# Patient Record
Sex: Female | Born: 1955 | Race: White | Hispanic: No | Marital: Married | Smoking: Never smoker
Health system: Southern US, Community
[De-identification: ages and names within clinical notes are randomized; demographics above are authoritative.]

## PROBLEM LIST (undated history)

## (undated) DIAGNOSIS — M8580 Other specified disorders of bone density and structure, unspecified site: Secondary | ICD-10-CM

## (undated) DIAGNOSIS — G479 Sleep disorder, unspecified: Secondary | ICD-10-CM

## (undated) DIAGNOSIS — T7840XA Allergy, unspecified, initial encounter: Secondary | ICD-10-CM

## (undated) DIAGNOSIS — E785 Hyperlipidemia, unspecified: Secondary | ICD-10-CM

## (undated) DIAGNOSIS — M069 Rheumatoid arthritis, unspecified: Secondary | ICD-10-CM

## (undated) DIAGNOSIS — F329 Major depressive disorder, single episode, unspecified: Secondary | ICD-10-CM

## (undated) DIAGNOSIS — E119 Type 2 diabetes mellitus without complications: Secondary | ICD-10-CM

## (undated) DIAGNOSIS — F32A Depression, unspecified: Secondary | ICD-10-CM

## (undated) HISTORY — DX: Other specified disorders of bone density and structure, unspecified site: M85.80

## (undated) HISTORY — DX: Depression, unspecified: F32.A

## (undated) HISTORY — DX: Major depressive disorder, single episode, unspecified: F32.9

## (undated) HISTORY — PX: TONSILLECTOMY: SUR1361

## (undated) HISTORY — DX: Hyperlipidemia, unspecified: E78.5

## (undated) HISTORY — DX: Sleep disorder, unspecified: G47.9

## (undated) HISTORY — DX: Type 2 diabetes mellitus without complications: E11.9

## (undated) HISTORY — DX: Allergy, unspecified, initial encounter: T78.40XA

## (undated) HISTORY — DX: Rheumatoid arthritis, unspecified: M06.9

---

## 1999-07-22 DIAGNOSIS — M069 Rheumatoid arthritis, unspecified: Secondary | ICD-10-CM

## 1999-07-22 HISTORY — DX: Rheumatoid arthritis, unspecified: M06.9

## 2005-07-21 DIAGNOSIS — M8580 Other specified disorders of bone density and structure, unspecified site: Secondary | ICD-10-CM

## 2005-07-21 HISTORY — DX: Other specified disorders of bone density and structure, unspecified site: M85.80

## 2006-09-23 LAB — HM COLONOSCOPY

## 2010-11-26 ENCOUNTER — Inpatient Hospital Stay (INDEPENDENT_AMBULATORY_CARE_PROVIDER_SITE_OTHER)
Admission: RE | Admit: 2010-11-26 | Discharge: 2010-11-26 | Disposition: A | Discharge status: Home / Self Care | Payer: PRIVATE HEALTH INSURANCE | Source: Ambulatory Visit | Attending: Family Medicine | Admitting: Family Medicine

## 2010-11-26 DIAGNOSIS — J40 Bronchitis, not specified as acute or chronic: Secondary | ICD-10-CM

## 2010-11-26 DIAGNOSIS — J069 Acute upper respiratory infection, unspecified: Secondary | ICD-10-CM

## 2011-03-20 LAB — HM MAMMOGRAPHY: HM Mammogram: NORMAL

## 2012-03-01 ENCOUNTER — Ambulatory Visit (INDEPENDENT_AMBULATORY_CARE_PROVIDER_SITE_OTHER): Payer: BC Managed Care – PPO | Admitting: Internal Medicine

## 2012-03-01 ENCOUNTER — Encounter: Payer: Self-pay | Admitting: Internal Medicine

## 2012-03-01 ENCOUNTER — Other Ambulatory Visit (HOSPITAL_COMMUNITY)
Admission: RE | Admit: 2012-03-01 | Discharge: 2012-03-01 | Disposition: A | Discharge status: Home / Self Care | Payer: BC Managed Care – PPO | Source: Ambulatory Visit | Attending: Internal Medicine | Admitting: Internal Medicine

## 2012-03-01 VITALS — BP 130/70 | HR 69 | Temp 98.4°F | Resp 16 | Ht 66.0 in | Wt 151.5 lb

## 2012-03-01 DIAGNOSIS — E785 Hyperlipidemia, unspecified: Secondary | ICD-10-CM | POA: Insufficient documentation

## 2012-03-01 DIAGNOSIS — M069 Rheumatoid arthritis, unspecified: Secondary | ICD-10-CM

## 2012-03-01 DIAGNOSIS — E7800 Pure hypercholesterolemia, unspecified: Secondary | ICD-10-CM

## 2012-03-01 DIAGNOSIS — Z01419 Encounter for gynecological examination (general) (routine) without abnormal findings: Secondary | ICD-10-CM | POA: Insufficient documentation

## 2012-03-01 DIAGNOSIS — Z124 Encounter for screening for malignant neoplasm of cervix: Secondary | ICD-10-CM

## 2012-03-01 DIAGNOSIS — K635 Polyp of colon: Secondary | ICD-10-CM

## 2012-03-01 DIAGNOSIS — E118 Type 2 diabetes mellitus with unspecified complications: Secondary | ICD-10-CM | POA: Insufficient documentation

## 2012-03-01 DIAGNOSIS — Z860100 Personal history of colon polyps, unspecified: Secondary | ICD-10-CM | POA: Insufficient documentation

## 2012-03-01 DIAGNOSIS — R7309 Other abnormal glucose: Secondary | ICD-10-CM

## 2012-03-01 DIAGNOSIS — Z0000 Encounter for general adult medical examination without abnormal findings: Secondary | ICD-10-CM | POA: Insufficient documentation

## 2012-03-01 DIAGNOSIS — M899 Disorder of bone, unspecified: Secondary | ICD-10-CM

## 2012-03-01 DIAGNOSIS — M8580 Other specified disorders of bone density and structure, unspecified site: Secondary | ICD-10-CM | POA: Insufficient documentation

## 2012-03-01 DIAGNOSIS — D126 Benign neoplasm of colon, unspecified: Secondary | ICD-10-CM

## 2012-03-01 LAB — HM PAP SMEAR: HM Pap smear: NORMAL

## 2012-03-01 LAB — FECAL OCCULT BLOOD, GUAIAC: Fecal Occult Blood: NEGATIVE

## 2012-03-01 NOTE — Assessment & Plan Note (Signed)
Exam done, PAP sent, labs were ordered, vaccines were reviewed, pt ed material was given

## 2012-03-01 NOTE — Patient Instructions (Signed)
Preventive Care for Adults, Female A healthy lifestyle and preventive care can promote health and wellness. Preventive health guidelines for women include the following key practices.  A routine yearly physical is a good way to check with your caregiver about your health and preventive screening. It is a chance to share any concerns and updates on your health, and to receive a thorough exam.   Visit your dentist for a routine exam and preventive care every 6 months. Brush your teeth twice a day and floss once a day. Good oral hygiene prevents tooth decay and gum disease.   The frequency of eye exams is based on your age, health, family medical history, use of contact lenses, and other factors. Follow your caregiver's recommendations for frequency of eye exams.   Eat a healthy diet. Foods like vegetables, fruits, whole grains, low-fat dairy products, and lean protein foods contain the nutrients you need without too many calories. Decrease your intake of foods high in solid fats, added sugars, and salt. Eat the right amount of calories for you.Get information about a proper diet from your caregiver, if necessary.   Regular physical exercise is one of the most important things you can do for your health. Most adults should get at least 150 minutes of moderate-intensity exercise (any activity that increases your heart rate and causes you to sweat) each week. In addition, most adults need muscle-strengthening exercises on 2 or more days a week.   Maintain a healthy weight. The body mass index (BMI) is a screening tool to identify possible weight problems. It provides an estimate of body fat based on height and weight. Your caregiver can help determine your BMI, and can help you achieve or maintain a healthy weight.For adults 20 years and older:   A BMI below 18.5 is considered underweight.   A BMI of 18.5 to 24.9 is normal.   A BMI of 25 to 29.9 is considered overweight.   A BMI of 30 and above is  considered obese.   Maintain normal blood lipids and cholesterol levels by exercising and minimizing your intake of saturated fat. Eat a balanced diet with plenty of fruit and vegetables. Blood tests for lipids and cholesterol should begin at age 20 and be repeated every 5 years. If your lipid or cholesterol levels are high, you are over 50, or you are at high risk for heart disease, you may need your cholesterol levels checked more frequently.Ongoing high lipid and cholesterol levels should be treated with medicines if diet and exercise are not effective.   If you smoke, find out from your caregiver how to quit. If you do not use tobacco, do not start.   If you are pregnant, do not drink alcohol. If you are breastfeeding, be very cautious about drinking alcohol. If you are not pregnant and choose to drink alcohol, do not exceed 1 drink per day. One drink is considered to be 12 ounces (355 mL) of beer, 5 ounces (148 mL) of wine, or 1.5 ounces (44 mL) of liquor.   Avoid use of street drugs. Do not share needles with anyone. Ask for help if you need support or instructions about stopping the use of drugs.   High blood pressure causes heart disease and increases the risk of stroke. Your blood pressure should be checked at least every 1 to 2 years. Ongoing high blood pressure should be treated with medicines if weight loss and exercise are not effective.   If you are 55 to 56   years old, ask your caregiver if you should take aspirin to prevent strokes.   Diabetes screening involves taking a blood sample to check your fasting blood sugar level. This should be done once every 3 years, after age 45, if you are within normal weight and without risk factors for diabetes. Testing should be considered at a younger age or be carried out more frequently if you are overweight and have at least 1 risk factor for diabetes.   Breast cancer screening is essential preventive care for women. You should practice "breast  self-awareness." This means understanding the normal appearance and feel of your breasts and may include breast self-examination. Any changes detected, no matter how small, should be reported to a caregiver. Women in their 20s and 30s should have a clinical breast exam (CBE) by a caregiver as part of a regular health exam every 1 to 3 years. After age 40, women should have a CBE every year. Starting at age 40, women should consider having a mammography (breast X-ray test) every year. Women who have a family history of breast cancer should talk to their caregiver about genetic screening. Women at a high risk of breast cancer should talk to their caregivers about having magnetic resonance imaging (MRI) and a mammography every year.   The Pap test is a screening test for cervical cancer. A Pap test can show cell changes on the cervix that might become cervical cancer if left untreated. A Pap test is a procedure in which cells are obtained and examined from the lower end of the uterus (cervix).   Women should have a Pap test starting at age 21.   Between ages 21 and 29, Pap tests should be repeated every 2 years.   Beginning at age 30, you should have a Pap test every 3 years as long as the past 3 Pap tests have been normal.   Some women have medical problems that increase the chance of getting cervical cancer. Talk to your caregiver about these problems. It is especially important to talk to your caregiver if a new problem develops soon after your last Pap test. In these cases, your caregiver may recommend more frequent screening and Pap tests.   The above recommendations are the same for women who have or have not gotten the vaccine for human papillomavirus (HPV).   If you had a hysterectomy for a problem that was not cancer or a condition that could lead to cancer, then you no longer need Pap tests. Even if you no longer need a Pap test, a regular exam is a good idea to make sure no other problems are  starting.   If you are between ages 65 and 70, and you have had normal Pap tests going back 10 years, you no longer need Pap tests. Even if you no longer need a Pap test, a regular exam is a good idea to make sure no other problems are starting.   If you have had past treatment for cervical cancer or a condition that could lead to cancer, you need Pap tests and screening for cancer for at least 20 years after your treatment.   If Pap tests have been discontinued, risk factors (such as a new sexual partner) need to be reassessed to determine if screening should be resumed.   The HPV test is an additional test that may be used for cervical cancer screening. The HPV test looks for the virus that can cause the cell changes on the cervix.   The cells collected during the Pap test can be tested for HPV. The HPV test could be used to screen women aged 30 years and older, and should be used in women of any age who have unclear Pap test results. After the age of 30, women should have HPV testing at the same frequency as a Pap test.   Colorectal cancer can be detected and often prevented. Most routine colorectal cancer screening begins at the age of 50 and continues through age 75. However, your caregiver may recommend screening at an earlier age if you have risk factors for colon cancer. On a yearly basis, your caregiver may provide home test kits to check for hidden blood in the stool. Use of a small camera at the end of a tube, to directly examine the colon (sigmoidoscopy or colonoscopy), can detect the earliest forms of colorectal cancer. Talk to your caregiver about this at age 50, when routine screening begins. Direct examination of the colon should be repeated every 5 to 10 years through age 75, unless early forms of pre-cancerous polyps or small growths are found.   Hepatitis C blood testing is recommended for all people born from 1945 through 1965 and any individual with known risks for hepatitis C.    Practice safe sex. Use condoms and avoid high-risk sexual practices to reduce the spread of sexually transmitted infections (STIs). STIs include gonorrhea, chlamydia, syphilis, trichomonas, herpes, HPV, and human immunodeficiency virus (HIV). Herpes, HIV, and HPV are viral illnesses that have no cure. They can result in disability, cancer, and death. Sexually active women aged 25 and younger should be checked for chlamydia. Older women with new or multiple partners should also be tested for chlamydia. Testing for other STIs is recommended if you are sexually active and at increased risk.   Osteoporosis is a disease in which the bones lose minerals and strength with aging. This can result in serious bone fractures. The risk of osteoporosis can be identified using a bone density scan. Women ages 65 and over and women at risk for fractures or osteoporosis should discuss screening with their caregivers. Ask your caregiver whether you should take a calcium supplement or vitamin D to reduce the rate of osteoporosis.   Menopause can be associated with physical symptoms and risks. Hormone replacement therapy is available to decrease symptoms and risks. You should talk to your caregiver about whether hormone replacement therapy is right for you.   Use sunscreen with sun protection factor (SPF) of 30 or more. Apply sunscreen liberally and repeatedly throughout the day. You should seek shade when your shadow is shorter than you. Protect yourself by wearing long sleeves, pants, a wide-brimmed hat, and sunglasses year round, whenever you are outdoors.   Once a month, do a whole body skin exam, using a mirror to look at the skin on your back. Notify your caregiver of new moles, moles that have irregular borders, moles that are larger than a pencil eraser, or moles that have changed in shape or color.   Stay current with required immunizations.   Influenza. You need a dose every fall (or winter). The composition of  the flu vaccine changes each year, so being vaccinated once is not enough.   Pneumococcal polysaccharide. You need 1 to 2 doses if you smoke cigarettes or if you have certain chronic medical conditions. You need 1 dose at age 65 (or older) if you have never been vaccinated.   Tetanus, diphtheria, pertussis (Tdap, Td). Get 1 dose of   Tdap vaccine if you are younger than age 65, are over 65 and have contact with an infant, are a healthcare worker, are pregnant, or simply want to be protected from whooping cough. After that, you need a Td booster dose every 10 years. Consult your caregiver if you have not had at least 3 tetanus and diphtheria-containing shots sometime in your life or have a deep or dirty wound.   HPV. You need this vaccine if you are a woman age 26 or younger. The vaccine is given in 3 doses over 6 months.   Measles, mumps, rubella (MMR). You need at least 1 dose of MMR if you were born in 1957 or later. You may also need a second dose.   Meningococcal. If you are age 19 to 21 and a first-year college student living in a residence hall, or have one of several medical conditions, you need to get vaccinated against meningococcal disease. You may also need additional booster doses.   Zoster (shingles). If you are age 60 or older, you should get this vaccine.   Varicella (chickenpox). If you have never had chickenpox or you were vaccinated but received only 1 dose, talk to your caregiver to find out if you need this vaccine.   Hepatitis A. You need this vaccine if you have a specific risk factor for hepatitis A virus infection or you simply wish to be protected from this disease. The vaccine is usually given as 2 doses, 6 to 18 months apart.   Hepatitis B. You need this vaccine if you have a specific risk factor for hepatitis B virus infection or you simply wish to be protected from this disease. The vaccine is given in 3 doses, usually over 6 months.  Preventive Services /  Frequency Ages 19 to 39  Blood pressure check.** / Every 1 to 2 years.   Lipid and cholesterol check.** / Every 5 years beginning at age 20.   Clinical breast exam.** / Every 3 years for women in their 20s and 30s.   Pap test.** / Every 2 years from ages 21 through 29. Every 3 years starting at age 30 through age 65 or 70 with a history of 3 consecutive normal Pap tests.   HPV screening.** / Every 3 years from ages 30 through ages 65 to 70 with a history of 3 consecutive normal Pap tests.   Hepatitis C blood test.** / For any individual with known risks for hepatitis C.   Skin self-exam. / Monthly.   Influenza immunization.** / Every year.   Pneumococcal polysaccharide immunization.** / 1 to 2 doses if you smoke cigarettes or if you have certain chronic medical conditions.   Tetanus, diphtheria, pertussis (Tdap, Td) immunization. / A one-time dose of Tdap vaccine. After that, you need a Td booster dose every 10 years.   HPV immunization. / 3 doses over 6 months, if you are 26 and younger.   Measles, mumps, rubella (MMR) immunization. / You need at least 1 dose of MMR if you were born in 1957 or later. You may also need a second dose.   Meningococcal immunization. / 1 dose if you are age 19 to 21 and a first-year college student living in a residence hall, or have one of several medical conditions, you need to get vaccinated against meningococcal disease. You may also need additional booster doses.   Varicella immunization.** / Consult your caregiver.   Hepatitis A immunization.** / Consult your caregiver. 2 doses, 6 to 18 months   apart.   Hepatitis B immunization.** / Consult your caregiver. 3 doses usually over 6 months.  Ages 40 to 64  Blood pressure check.** / Every 1 to 2 years.   Lipid and cholesterol check.** / Every 5 years beginning at age 20.   Clinical breast exam.** / Every year after age 40.   Mammogram.** / Every year beginning at age 40 and continuing for as  long as you are in good health. Consult with your caregiver.   Pap test.** / Every 3 years starting at age 30 through age 65 or 70 with a history of 3 consecutive normal Pap tests.   HPV screening.** / Every 3 years from ages 30 through ages 65 to 70 with a history of 3 consecutive normal Pap tests.   Fecal occult blood test (FOBT) of stool. / Every year beginning at age 50 and continuing until age 75. You may not need to do this test if you get a colonoscopy every 10 years.   Flexible sigmoidoscopy or colonoscopy.** / Every 5 years for a flexible sigmoidoscopy or every 10 years for a colonoscopy beginning at age 50 and continuing until age 75.   Hepatitis C blood test.** / For all people born from 1945 through 1965 and any individual with known risks for hepatitis C.   Skin self-exam. / Monthly.   Influenza immunization.** / Every year.   Pneumococcal polysaccharide immunization.** / 1 to 2 doses if you smoke cigarettes or if you have certain chronic medical conditions.   Tetanus, diphtheria, pertussis (Tdap, Td) immunization.** / A one-time dose of Tdap vaccine. After that, you need a Td booster dose every 10 years.   Measles, mumps, rubella (MMR) immunization. / You need at least 1 dose of MMR if you were born in 1957 or later. You may also need a second dose.   Varicella immunization.** / Consult your caregiver.   Meningococcal immunization.** / Consult your caregiver.   Hepatitis A immunization.** / Consult your caregiver. 2 doses, 6 to 18 months apart.   Hepatitis B immunization.** / Consult your caregiver. 3 doses, usually over 6 months.  Ages 65 and over  Blood pressure check.** / Every 1 to 2 years.   Lipid and cholesterol check.** / Every 5 years beginning at age 20.   Clinical breast exam.** / Every year after age 40.   Mammogram.** / Every year beginning at age 40 and continuing for as long as you are in good health. Consult with your caregiver.   Pap test.** /  Every 3 years starting at age 30 through age 65 or 70 with a 3 consecutive normal Pap tests. Testing can be stopped between 65 and 70 with 3 consecutive normal Pap tests and no abnormal Pap or HPV tests in the past 10 years.   HPV screening.** / Every 3 years from ages 30 through ages 65 or 70 with a history of 3 consecutive normal Pap tests. Testing can be stopped between 65 and 70 with 3 consecutive normal Pap tests and no abnormal Pap or HPV tests in the past 10 years.   Fecal occult blood test (FOBT) of stool. / Every year beginning at age 50 and continuing until age 75. You may not need to do this test if you get a colonoscopy every 10 years.   Flexible sigmoidoscopy or colonoscopy.** / Every 5 years for a flexible sigmoidoscopy or every 10 years for a colonoscopy beginning at age 50 and continuing until age 75.   Hepatitis   C blood test.** / For all people born from 1945 through 1965 and any individual with known risks for hepatitis C.   Osteoporosis screening.** / A one-time screening for women ages 65 and over and women at risk for fractures or osteoporosis.   Skin self-exam. / Monthly.   Influenza immunization.** / Every year.   Pneumococcal polysaccharide immunization.** / 1 dose at age 65 (or older) if you have never been vaccinated.   Tetanus, diphtheria, pertussis (Tdap, Td) immunization. / A one-time dose of Tdap vaccine if you are over 65 and have contact with an infant, are a healthcare worker, or simply want to be protected from whooping cough. After that, you need a Td booster dose every 10 years.   Varicella immunization.** / Consult your caregiver.   Meningococcal immunization.** / Consult your caregiver.   Hepatitis A immunization.** / Consult your caregiver. 2 doses, 6 to 18 months apart.   Hepatitis B immunization.** / Check with your caregiver. 3 doses, usually over 6 months.  ** Family history and personal history of risk and conditions may change your caregiver's  recommendations. Document Released: 09/02/2001 Document Revised: 06/26/2011 Document Reviewed: 12/02/2010 [employer] Patient Information 2012 [employer]. 

## 2012-03-01 NOTE — Assessment & Plan Note (Signed)
I will check her Vit D level and have asked her to get an updated DEXA scan done

## 2012-03-01 NOTE — Progress Notes (Signed)
Subjective:    Patient ID: Michele Adkins, female    DOB: [birth year], 56 y.o.   MRN: [id number]  HPI  New to me for a complete physical, she has mild arthralgias in her hands but otherwise she feels well.  Review of Systems  Constitutional: Negative for fever, chills, diaphoresis, activity change, appetite change, fatigue and unexpected weight change.  HENT: Negative.   Eyes: Negative.   Respiratory: Negative for cough, chest tightness, shortness of breath, wheezing and stridor.   Cardiovascular: Negative for chest pain, palpitations and leg swelling.  Gastrointestinal: Negative for nausea, vomiting, abdominal pain, diarrhea, constipation, blood in stool and anal bleeding.  Genitourinary: Negative.   Musculoskeletal: Positive for arthralgias. Negative for myalgias, back pain, joint swelling and gait problem.  Skin: Negative for color change, pallor, rash and wound.  Neurological: Negative.   Hematological: Negative for adenopathy. Does not bruise/bleed easily.  Psychiatric/Behavioral: Negative.        Objective:   Physical Exam  Vitals reviewed. Constitutional: She is oriented to person, place, and time. She appears well-developed and well-nourished. No distress.  HENT:  Head: Normocephalic and atraumatic.  Mouth/Throat: Oropharynx is clear and moist. No oropharyngeal exudate.  Eyes: Conjunctivae are normal. Right eye exhibits no discharge. Left eye exhibits no discharge. No scleral icterus.  Neck: Normal range of motion. Neck supple. No JVD present. No tracheal deviation present. No thyromegaly present.  Cardiovascular: Normal rate, regular rhythm, normal heart sounds and intact distal pulses.  Exam reveals no gallop and no friction rub.   No murmur heard. Pulmonary/Chest: Effort normal and breath sounds normal. No stridor. No respiratory distress. She has no wheezes. She has no rales. She exhibits no tenderness.  Abdominal: Soft. Bowel sounds are normal. She exhibits no  distension and no mass. There is no tenderness. There is no rebound and no guarding. Hernia confirmed negative in the right inguinal area and confirmed negative in the left inguinal area.  Genitourinary: Vagina normal and uterus normal. Rectal exam shows no external hemorrhoid, no internal hemorrhoid, no fissure, no mass, no tenderness and anal tone normal. Guaiac negative stool. No breast swelling, tenderness, discharge or bleeding. Pelvic exam was performed with patient supine. No labial fusion. There is no rash, tenderness, lesion or injury on the right labia. There is no rash, tenderness, lesion or injury on the left labia. Uterus is not deviated, not enlarged, not fixed and not tender. Cervix exhibits no motion tenderness, no discharge and no friability. Right adnexum displays no mass, no tenderness and no fullness. Left adnexum displays no mass, no tenderness and no fullness. No erythema, tenderness or bleeding around the vagina. No foreign body around the vagina. No signs of injury around the vagina. No vaginal discharge found.  Musculoskeletal: Normal range of motion. She exhibits no edema and no tenderness.  Lymphadenopathy:    She has no cervical adenopathy.       Right: No inguinal adenopathy present.       Left: No inguinal adenopathy present.  Neurological: She is alert and oriented to person, place, and time. She has normal reflexes. She displays normal reflexes. No cranial nerve deficit. She exhibits normal muscle tone. Coordination normal.  Skin: Skin is warm and dry. No rash noted. She is not diaphoretic. No erythema. No pallor.  Psychiatric: She has a normal mood and affect. Her behavior is normal. Judgment and thought content normal.      No results found for this basename: WBC, HGB, HCT, PLT, GLUCOSE, CHOL, TRIG, HDL, LDLDIRECT,  LDLCALC, ALT, AST, NA, K, CL, CREATININE, BUN, CO2, TSH, PSA, INR, GLUF, HGBA1C, MICROALBUR      Assessment & Plan:

## 2012-03-01 NOTE — Assessment & Plan Note (Signed)
At her request I will check her ESR and CRP today and I have referred her to Rheumatology for further evaluation

## 2012-03-01 NOTE — Assessment & Plan Note (Signed)
FLP ordered

## 2012-03-01 NOTE — Assessment & Plan Note (Signed)
I will check her A1C to see if she has developed DM II 

## 2012-03-01 NOTE — Assessment & Plan Note (Signed)
She is due for a repeat colonoscopy 

## 2012-03-02 ENCOUNTER — Encounter: Payer: Self-pay | Admitting: Internal Medicine

## 2012-03-05 ENCOUNTER — Encounter: Payer: Self-pay | Admitting: Internal Medicine

## 2012-03-09 ENCOUNTER — Ambulatory Visit (INDEPENDENT_AMBULATORY_CARE_PROVIDER_SITE_OTHER)
Admission: RE | Admit: 2012-03-09 | Discharge: 2012-03-09 | Disposition: A | Discharge status: Home / Self Care | Payer: BC Managed Care – PPO | Source: Ambulatory Visit | Attending: Internal Medicine | Admitting: Internal Medicine

## 2012-03-09 ENCOUNTER — Other Ambulatory Visit (INDEPENDENT_AMBULATORY_CARE_PROVIDER_SITE_OTHER): Payer: BC Managed Care – PPO

## 2012-03-09 DIAGNOSIS — M069 Rheumatoid arthritis, unspecified: Secondary | ICD-10-CM

## 2012-03-09 DIAGNOSIS — Z0000 Encounter for general adult medical examination without abnormal findings: Secondary | ICD-10-CM

## 2012-03-09 DIAGNOSIS — R7309 Other abnormal glucose: Secondary | ICD-10-CM

## 2012-03-09 DIAGNOSIS — M949 Disorder of cartilage, unspecified: Secondary | ICD-10-CM

## 2012-03-09 DIAGNOSIS — M899 Disorder of bone, unspecified: Secondary | ICD-10-CM

## 2012-03-09 DIAGNOSIS — M8580 Other specified disorders of bone density and structure, unspecified site: Secondary | ICD-10-CM

## 2012-03-09 DIAGNOSIS — E7800 Pure hypercholesterolemia, unspecified: Secondary | ICD-10-CM

## 2012-03-09 LAB — COMPREHENSIVE METABOLIC PANEL
ALT: 16 U/L (ref 0–35)
AST: 18 U/L (ref 0–37)
Albumin: 3.8 g/dL (ref 3.5–5.2)
Alkaline Phosphatase: 69 U/L (ref 39–117)
BUN: 10 mg/dL (ref 6–23)
CO2: 30 mEq/L (ref 19–32)
Calcium: 9.5 mg/dL (ref 8.4–10.5)
Chloride: 102 mEq/L (ref 96–112)
Creatinine, Ser: 0.6 mg/dL (ref 0.4–1.2)
GFR: 101.92 mL/min (ref >60.00)
Glucose, Bld: 89 mg/dL (ref 70–99)
Potassium: 4.6 mEq/L (ref 3.5–5.1)
Sodium: 139 mEq/L (ref 135–145)
Total Bilirubin: 0.9 mg/dL (ref 0.3–1.2)
Total Protein: 6.9 g/dL (ref 6.0–8.3)

## 2012-03-09 LAB — CBC WITH DIFFERENTIAL/PLATELET
Basophils Absolute: 0.1 10*3/uL (ref 0.0–0.1)
Basophils Relative: 1.3 % (ref 0.0–3.0)
Eosinophils Absolute: 0.1 10*3/uL (ref 0.0–0.7)
Eosinophils Relative: 1.3 % (ref 0.0–5.0)
HCT: 39.8 % (ref 36.0–46.0)
Hemoglobin: 13.1 g/dL (ref 12.0–15.0)
Lymphocytes Relative: 31.4 % (ref 12.0–46.0)
Lymphs Abs: 1.9 10*3/uL (ref 0.7–4.0)
MCHC: 33 g/dL (ref 30.0–36.0)
MCV: 87.3 fl (ref 78.0–100.0)
Monocytes Absolute: 0.5 10*3/uL (ref 0.1–1.0)
Monocytes Relative: 8.5 % (ref 3.0–12.0)
Neutro Abs: 3.6 10*3/uL (ref 1.4–7.7)
Neutrophils Relative %: 57.5 % (ref 43.0–77.0)
Platelets: 365 10*3/uL (ref 150.0–400.0)
RBC: 4.56 Mil/uL (ref 3.87–5.11)
RDW: 14.4 % (ref 11.5–14.6)
WBC: 6.2 10*3/uL (ref 4.5–10.5)

## 2012-03-09 LAB — LDL CHOLESTEROL, DIRECT: Direct LDL: 136.1 mg/dL

## 2012-03-09 LAB — URINALYSIS, ROUTINE W REFLEX MICROSCOPIC
Bilirubin Urine: NEGATIVE
Hgb urine dipstick: NEGATIVE
Ketones, ur: NEGATIVE
Leukocytes, UA: NEGATIVE
Nitrite: NEGATIVE
Specific Gravity, Urine: 1.01 (ref 1.000–1.030)
Total Protein, Urine: NEGATIVE
Urine Glucose: NEGATIVE
Urobilinogen, UA: 0.2 (ref 0.0–1.0)
pH: 7 (ref 5.0–8.0)

## 2012-03-09 LAB — C-REACTIVE PROTEIN: CRP: 0.9 mg/dL (ref 0.5–20.0)

## 2012-03-09 LAB — LIPID PANEL
Cholesterol: 208 mg/dL — ABNORMAL HIGH (ref 0–200)
HDL: 55.7 mg/dL (ref >39.00)
Total CHOL/HDL Ratio: 4
Triglycerides: 90 mg/dL (ref 0.0–149.0)
VLDL: 18 mg/dL (ref 0.0–40.0)

## 2012-03-09 LAB — TSH: TSH: 2.07 u[IU]/mL (ref 0.35–5.50)

## 2012-03-09 LAB — HEMOGLOBIN A1C: Hgb A1c MFr Bld: 5.9 % (ref 4.6–6.5)

## 2012-03-09 LAB — SEDIMENTATION RATE: Sed Rate: 22 mm/hr (ref 0–22)

## 2012-03-13 LAB — VITAMIN D 1,25 DIHYDROXY
Vitamin D 1, 25 (OH)2 Total: 59 pg/mL (ref 18–72)
Vitamin D2 1, 25 (OH)2: <8 pg/mL
Vitamin D3 1, 25 (OH)2: 59 pg/mL

## 2012-03-14 ENCOUNTER — Encounter: Payer: Self-pay | Admitting: Internal Medicine

## 2012-03-25 ENCOUNTER — Telehealth: Payer: Self-pay

## 2012-03-25 DIAGNOSIS — M069 Rheumatoid arthritis, unspecified: Secondary | ICD-10-CM

## 2012-03-25 NOTE — Telephone Encounter (Signed)
Pt called stating that after doing some research she as discovered that the regular CRP lab testing is not necessarily valid for rheumatoid arthritis. Pt is requesting lab order for high sensitivity CRP, please advise.

## 2012-03-25 NOTE — Telephone Encounter (Signed)
Pt advised of lab order.

## 2012-03-25 NOTE — Telephone Encounter (Signed)
done

## 2012-03-26 ENCOUNTER — Telehealth: Payer: Self-pay

## 2012-03-26 ENCOUNTER — Other Ambulatory Visit (INDEPENDENT_AMBULATORY_CARE_PROVIDER_SITE_OTHER): Payer: BC Managed Care – PPO

## 2012-03-26 ENCOUNTER — Other Ambulatory Visit: Payer: Self-pay

## 2012-03-26 DIAGNOSIS — M069 Rheumatoid arthritis, unspecified: Secondary | ICD-10-CM

## 2012-03-26 LAB — HIGH SENSITIVITY CRP: CRP, High Sensitivity: 12.28 mg/dL — ABNORMAL HIGH (ref 0.000–5.000)

## 2012-03-26 MED ORDER — FLUOXETINE HCL 40 MG PO CAPS: 40.0000 mg | ORAL_CAPSULE | Freq: Every day | ORAL | Status: DC

## 2012-03-26 NOTE — Telephone Encounter (Signed)
The report says "preliminary" but does not report a result

## 2012-03-26 NOTE — Telephone Encounter (Signed)
Pt called requesting results of recent DXA scan, please advise.

## 2012-03-26 NOTE — Telephone Encounter (Signed)
Pt advised in person by Registrar Victorino Dike

## 2012-04-01 ENCOUNTER — Ambulatory Visit (AMBULATORY_SURGERY_CENTER): Payer: BC Managed Care – PPO | Admitting: *Deleted

## 2012-04-01 ENCOUNTER — Encounter: Payer: Self-pay | Admitting: Internal Medicine

## 2012-04-01 VITALS — Ht 66.5 in | Wt 155.1 lb

## 2012-04-01 DIAGNOSIS — Z1211 Encounter for screening for malignant neoplasm of colon: Secondary | ICD-10-CM

## 2012-04-01 MED ORDER — NA SULFATE-K SULFATE-MG SULF 17.5-3.13-1.6 GM/177ML PO SOLN: ORAL | Status: DC

## 2012-04-01 NOTE — Progress Notes (Signed)
Pt says she had colonoscopy over 5 years ago in [place], [place].  She does not remember the name of doctor.  She will check her records at home and call back with MD's name.

## 2012-04-07 ENCOUNTER — Telehealth: Payer: Self-pay | Admitting: *Deleted

## 2012-04-07 NOTE — Telephone Encounter (Signed)
Pt request DEXA results/SLS

## 2012-04-08 ENCOUNTER — Telehealth: Payer: Self-pay | Admitting: *Deleted

## 2012-04-08 NOTE — Telephone Encounter (Signed)
Pt scheduled for colonoscopy with Dr. Leone Payor 9/26.  Last colonoscopy 5+ years ago with Dr. Adron Bene in [place], [place].  Pt will sign release of information form and will be given to Patty [place], CMA.

## 2012-04-09 NOTE — Telephone Encounter (Signed)
Spinal bone density is nl. I can't find the hip image/report Thx

## 2012-04-09 NOTE — Telephone Encounter (Signed)
Please review and advise in dr Yetta Barre' absence, pt is anxious to have results, thanks!

## 2012-04-12 NOTE — Telephone Encounter (Signed)
It does not appear that a result has been documented. However read the DEXA scan needs to be informed that it does not appear that there is a final report

## 2012-04-12 NOTE — Telephone Encounter (Signed)
Faxed ROI  04/12/12 .

## 2012-04-12 NOTE — Telephone Encounter (Signed)
Dr Yetta Barre, please review.

## 2012-04-13 NOTE — Telephone Encounter (Signed)
Pt called requesting a return call regarding DXA scan.

## 2012-04-15 ENCOUNTER — Encounter: Payer: Self-pay | Admitting: Internal Medicine

## 2012-04-15 ENCOUNTER — Ambulatory Visit (AMBULATORY_SURGERY_CENTER): Payer: BC Managed Care – PPO | Admitting: Internal Medicine

## 2012-04-15 VITALS — BP 112/70 | HR 62 | Temp 96.8°F | Resp 16 | Ht 66.0 in | Wt 155.0 lb

## 2012-04-15 DIAGNOSIS — Z1211 Encounter for screening for malignant neoplasm of colon: Secondary | ICD-10-CM

## 2012-04-15 DIAGNOSIS — Z86010 Personal history of colonic polyps: Secondary | ICD-10-CM

## 2012-04-15 DIAGNOSIS — K648 Other hemorrhoids: Secondary | ICD-10-CM

## 2012-04-15 LAB — HM COLONOSCOPY: HM Colonoscopy: NORMAL

## 2012-04-15 MED ORDER — SODIUM CHLORIDE 0.9 % IV SOLN: 500.0000 mL | INTRAVENOUS | Status: DC

## 2012-04-15 NOTE — Patient Instructions (Addendum)
YOU HAD AN ENDOSCOPIC PROCEDURE TODAY AT THE [facility]: Refer to the procedure report that was given to you for any specific questions about what was found during the examination.  If the procedure report does not answer your questions, please call your gastroenterologist to clarify.  If you requested that your care partner not be given the details of your procedure findings, then the procedure report has been included in a sealed envelope for you to review at your convenience later.  YOU SHOULD EXPECT: Some feelings of bloating in the abdomen. Passage of more gas than usual.  Walking can help get rid of the air that was put into your GI tract during the procedure and reduce the bloating. If you had a lower endoscopy (such as a colonoscopy or flexible sigmoidoscopy) you may notice spotting of blood in your stool or on the toilet paper. If you underwent a bowel prep for your procedure, then you may not have a normal bowel movement for a few days.  DIET: Your first meal following the procedure should be a light meal and then it is ok to progress to your normal diet.  A half-sandwich or bowl of soup is an example of a good first meal.  Heavy or fried foods are harder to digest and may make you feel nauseous or bloated.  Likewise meals heavy in dairy and vegetables can cause extra gas to form and this can also increase the bloating.  Drink plenty of fluids but you should avoid alcoholic beverages for 24 hours.  ACTIVITY: Your care partner should take you home directly after the procedure.  You should plan to take it easy, moving slowly for the rest of the day.  You can resume normal activity the day after the procedure however you should NOT DRIVE or use heavy machinery for 24 hours (because of the sedation medicines used during the test).    SYMPTOMS TO REPORT IMMEDIATELY: A gastroenterologist can be reached at any hour.  During normal business hours, 8:30 AM to 5:00 PM Monday through Friday,  call [phone].  After hours and on weekends, please call the GI answering service at [phone] who will take a message and have the physician on call contact you.   Following lower endoscopy (colonoscopy or flexible sigmoidoscopy):  Excessive amounts of blood in the stool  Significant tenderness or worsening of abdominal pains  Swelling of the abdomen that is new, acute  Fever of 100F or higher  FOLLOW UP: If any biopsies were taken you will be contacted by phone or by letter within the next 1-3 weeks.  Call your gastroenterologist if you have not heard about the biopsies in 3 weeks.  Our staff will call the home number listed on your records the next business day following your procedure to check on you and address any questions or concerns that you may have at that time regarding the information given to you following your procedure. This is a courtesy call and so if there is no answer at the home number and we have not heard from you through the emergency physician on call, we will assume that you have returned to your regular daily activities without incident.  SIGNATURES/CONFIDENTIALITY: You and/or your care partner have signed paperwork which will be entered into your electronic medical record.  These signatures attest to the fact that that the information above on your After Visit Summary has been reviewed and is understood.  Full responsibility of the confidentiality of this   discharge information lies with you and/or your care-partner.   Thank-you for choosing us for your healthcare needs. 

## 2012-04-15 NOTE — Progress Notes (Addendum)
Patient did not have preoperative order for IV antibiotic SSI prophylaxis. (G8918)  Patient did not experience any of the following events: a burn prior to discharge; a fall within the facility; wrong site/side/patient/procedure/implant event; or a hospital transfer or hospital admission upon discharge from the facility. (G8907)  

## 2012-04-15 NOTE — Op Note (Signed)
[facility] [address] [place], [address]   COLONOSCOPY PROCEDURE REPORT  PATIENT: Michele Adkins, Michele Adkins  MR#: [id number] BIRTHDATE: Nov 01, 1955 , 56  yrs. old GENDER: Female ENDOSCOPIST: Iva Boop, MD, [facility] REFERRED NF:[id number] Karsten Ro, M.D. PROCEDURE DATE:  04/15/2012 PROCEDURE:   Colonoscopy, diagnostic ASA CLASS:   Class II INDICATIONS:screening and surveillance,personal history of colonic polyps (she thinks -  2007) MEDICATIONS: Propofol (Diprivan) 230 mg IV, MAC sedation, administered by CRNA, and These medications were titrated to patient response per physician's verbal order  DESCRIPTION OF PROCEDURE:   After the risks benefits and alternatives of the procedure were thoroughly explained, informed consent was obtained.  A digital rectal exam revealed no abnormalities of the rectum.   The LB [id number]  endoscope was introduced through the anus and advanced to the cecum, which was identified by both the appendix and ileocecal valve. No adverse events experienced.   The quality of the prep was Suprep excellent The instrument was then slowly withdrawn as the colon was fully examined.      COLON FINDINGS: A normal appearing cecum, ileocecal valve, and appendiceal orifice were identified.  The ascending, hepatic flexure, transverse, splenic flexure, descending, sigmoid colon and rectum appeared unremarkable.  No polyps or cancers were seen. Small internal hemorrhoids were found.  Retroflexed views revealed internal hemorrhoids. The time to cecum=2 minutes 25 seconds. Withdrawal time=9 minutes 05 seconds.  The scope was withdrawn and the procedure completed. COMPLICATIONS: There were no complications.  ENDOSCOPIC IMPRESSION: 1.   Normal colon -excellent prep 2.   Small internal hemorrhoids  RECOMMENDATIONS: repeat Colonscopy in 10 years.   eSigned:  Iva Boop, MD, [facility] 04/15/2012 11:52 AM   cc: Etta Grandchild, MD and The  Patient

## 2012-04-15 NOTE — Progress Notes (Signed)
PROPOFOL PER J MULLINS CRNA, ALL MEDS TITRATED PER CRNA DURING THE PROCEDURE. SEE SCANNED PROCEDURE REPORT. EWM

## 2012-04-15 NOTE — Telephone Encounter (Signed)
Called xray to check status of report and was advised that report was never read by the reading MD. They have sent an e-mail to him to sign off on so that results can be given to pt.

## 2012-04-16 ENCOUNTER — Telehealth: Payer: Self-pay

## 2012-04-16 NOTE — Telephone Encounter (Signed)
Called patient to advise that bone dex scan was normal//LMOVM

## 2012-04-16 NOTE — Telephone Encounter (Signed)
Left message on answering machine. 

## 2012-04-22 ENCOUNTER — Encounter: Payer: Self-pay | Admitting: Internal Medicine

## 2012-04-25 LAB — HM DEXA SCAN: HM Dexa Scan: -1

## 2012-06-11 LAB — HM MAMMOGRAPHY: HM Mammogram: NORMAL

## 2013-03-11 ENCOUNTER — Encounter: Payer: Self-pay | Admitting: Internal Medicine

## 2013-03-11 ENCOUNTER — Ambulatory Visit (INDEPENDENT_AMBULATORY_CARE_PROVIDER_SITE_OTHER): Payer: BC Managed Care – PPO | Admitting: Internal Medicine

## 2013-03-11 VITALS — BP 136/88 | HR 85 | Temp 98.1°F | Resp 16 | Ht 66.0 in | Wt 160.2 lb

## 2013-03-11 DIAGNOSIS — F32A Depression, unspecified: Secondary | ICD-10-CM

## 2013-03-11 DIAGNOSIS — Z0000 Encounter for general adult medical examination without abnormal findings: Secondary | ICD-10-CM

## 2013-03-11 DIAGNOSIS — Z124 Encounter for screening for malignant neoplasm of cervix: Secondary | ICD-10-CM

## 2013-03-11 DIAGNOSIS — M069 Rheumatoid arthritis, unspecified: Secondary | ICD-10-CM

## 2013-03-11 DIAGNOSIS — F418 Other specified anxiety disorders: Secondary | ICD-10-CM | POA: Insufficient documentation

## 2013-03-11 DIAGNOSIS — F3289 Other specified depressive episodes: Secondary | ICD-10-CM

## 2013-03-11 DIAGNOSIS — F329 Major depressive disorder, single episode, unspecified: Secondary | ICD-10-CM

## 2013-03-11 MED ORDER — BUPROPION HCL ER (XL) 150 MG PO TB24: 150.0000 mg | ORAL_TABLET | Freq: Every day | ORAL | Status: DC

## 2013-03-11 NOTE — Patient Instructions (Signed)
Depression, Adult Depression refers to feeling sad, low, down in the dumps, blue, gloomy, or empty. In general, there are two kinds of depression: 1. Depression that we all experience from time to time because of upsetting life experiences, including the loss of a job or the ending of a relationship (normal sadness or normal grief). This kind of depression is considered normal, is short lived, and resolves within a few days to 2 weeks. (Depression experienced after the loss of a loved one is called bereavement. Bereavement often lasts longer than 2 weeks but normally gets better with time.) 2. Clinical depression, which lasts longer than normal sadness or normal grief or interferes with your ability to function at home, at work, and in school. It also interferes with your personal relationships. It affects almost every aspect of your life. Clinical depression is an illness. Symptoms of depression also can be caused by conditions other than normal sadness and grief or clinical depression. Examples of these conditions are listed as follows:  Physical illness Some physical illnesses, including underactive thyroid gland (hypothyroidism), severe anemia, specific types of cancer, diabetes, uncontrolled seizures, heart and lung problems, strokes, and chronic pain are commonly associated with symptoms of depression.  Side effects of some prescription medicine In some people, certain types of prescription medicine can cause symptoms of depression.  Substance abuse Abuse of alcohol and illicit drugs can cause symptoms of depression. SYMPTOMS Symptoms of normal sadness and normal grief include the following:  Feeling sad or crying for short periods of time.  Not caring about anything (apathy).  Difficulty sleeping or sleeping too much.  No longer able to enjoy the things you used to enjoy.  Desire to be by oneself all the time (social isolation).  Lack of energy or motivation.  Difficulty  concentrating or remembering.  Change in appetite or weight.  Restlessness or agitation. Symptoms of clinical depression include the same symptoms of normal sadness or normal grief and also the following symptoms:  Feeling sad or crying all the time.  Feelings of guilt or worthlessness.  Feelings of hopelessness or helplessness.  Thoughts of suicide or the desire to harm yourself (suicidal ideation).  Loss of touch with reality (psychotic symptoms). Seeing or hearing things that are not real (hallucinations) or having false beliefs about your life or the people around you (delusions and paranoia). DIAGNOSIS  The diagnosis of clinical depression usually is based on the severity and duration of the symptoms. Your caregiver also will ask you questions about your medical history and substance use to find out if physical illness, use of prescription medicine, or substance abuse is causing your depression. Your caregiver also may order blood tests. TREATMENT  Typically, normal sadness and normal grief do not require treatment. However, sometimes antidepressant medicine is prescribed for bereavement to ease the depressive symptoms until they resolve. The treatment for clinical depression depends on the severity of your symptoms but typically includes antidepressant medicine, counseling with a mental health professional, or a combination of both. Your caregiver will help to determine what treatment is best for you. Depression caused by physical illness usually goes away with appropriate medical treatment of the illness. If prescription medicine is causing depression, talk with your caregiver about stopping the medicine, decreasing the dose, or substituting another medicine. Depression caused by abuse of alcohol or illicit drugs abuse goes away with abstinence from these substances. Some adults need professional help in order to stop drinking or using drugs. SEEK IMMEDIATE CARE IF:  You have   thoughts  about hurting yourself or others.  You lose touch with reality (have psychotic symptoms).  You are taking medicine for depression and have a serious side effect. FOR MORE INFORMATION National Alliance on Mental Illness: www.nami.[employer]: http://www.maynard.net/ Document Released: 07/04/2000 Document Revised: 01/06/2012 Document Reviewed: 10/06/2011 [facility] Patient Information 2014 [place], [place]. Preventive Care for Adults, Female A healthy lifestyle and preventive care can promote health and wellness. Preventive health guidelines for women include the following key practices.  A routine yearly physical is a good way to check with your caregiver about your health and preventive screening. It is a chance to share any concerns and updates on your health, and to receive a thorough exam.  Visit your dentist for a routine exam and preventive care every 6 months. Brush your teeth twice a day and floss once a day. Good oral hygiene prevents tooth decay and gum disease.  The frequency of eye exams is based on your age, health, family medical history, use of contact lenses, and other factors. Follow your caregiver's recommendations for frequency of eye exams.  Eat a healthy diet. Foods like vegetables, fruits, whole grains, low-fat dairy products, and lean protein foods contain the nutrients you need without too many calories. Decrease your intake of foods high in solid fats, added sugars, and salt. Eat the right amount of calories for you.Get information about a proper diet from your caregiver, if necessary.  Regular physical exercise is one of the most important things you can do for your health. Most adults should get at least 150 minutes of moderate-intensity exercise (any activity that increases your heart rate and causes you to sweat) each week. In addition, most adults need muscle-strengthening exercises on 2 or more days a week.  Maintain a healthy weight. The body  mass index (BMI) is a screening tool to identify possible weight problems. It provides an estimate of body fat based on height and weight. Your caregiver can help determine your BMI, and can help you achieve or maintain a healthy weight.For adults 20 years and older:  A BMI below 18.5 is considered underweight.  A BMI of 18.5 to 24.9 is normal.  A BMI of 25 to 29.9 is considered overweight.  A BMI of 30 and above is considered obese.  Maintain normal blood lipids and cholesterol levels by exercising and minimizing your intake of saturated fat. Eat a balanced diet with plenty of fruit and vegetables. Blood tests for lipids and cholesterol should begin at age 54 and be repeated every 5 years. If your lipid or cholesterol levels are high, you are over 50, or you are at high risk for heart disease, you may need your cholesterol levels checked more frequently.Ongoing high lipid and cholesterol levels should be treated with medicines if diet and exercise are not effective.  If you smoke, find out from your caregiver how to quit. If you do not use tobacco, do not start.  If you are pregnant, do not drink alcohol. If you are breastfeeding, be very cautious about drinking alcohol. If you are not pregnant and choose to drink alcohol, do not exceed 1 drink per day. One drink is considered to be 12 ounces (355 mL) of beer, 5 ounces (148 mL) of wine, or 1.5 ounces (44 mL) of liquor.  Avoid use of street drugs. Do not share needles with anyone. Ask for help if you need support or instructions about stopping the use of drugs.  High blood pressure causes heart  disease and increases the risk of stroke. Your blood pressure should be checked at least every 1 to 2 years. Ongoing high blood pressure should be treated with medicines if weight loss and exercise are not effective.  If you are 68 to 57 years old, ask your caregiver if you should take aspirin to prevent strokes.  Diabetes screening involves taking a  blood sample to check your fasting blood sugar level. This should be done once every 3 years, after age 40, if you are within normal weight and without risk factors for diabetes. Testing should be considered at a younger age or be carried out more frequently if you are overweight and have at least 1 risk factor for diabetes.  Breast cancer screening is essential preventive care for women. You should practice "breast self-awareness." This means understanding the normal appearance and feel of your breasts and may include breast self-examination. Any changes detected, no matter how small, should be reported to a caregiver. Women in their 76s and 30s should have a clinical breast exam (CBE) by a caregiver as part of a regular health exam every 1 to 3 years. After age 59, women should have a CBE every year. Starting at age 19, women should consider having a mammography (breast X-ray test) every year. Women who have a family history of breast cancer should talk to their caregiver about genetic screening. Women at a high risk of breast cancer should talk to their caregivers about having magnetic resonance imaging (MRI) and a mammography every year.  The Pap test is a screening test for cervical cancer. A Pap test can show cell changes on the cervix that might become cervical cancer if left untreated. A Pap test is a procedure in which cells are obtained and examined from the lower end of the uterus (cervix).  Women should have a Pap test starting at age 11.  Between ages 35 and 49, Pap tests should be repeated every 2 years.  Beginning at age 42, you should have a Pap test every 3 years as long as the past 3 Pap tests have been normal.  Some women have medical problems that increase the chance of getting cervical cancer. Talk to your caregiver about these problems. It is especially important to talk to your caregiver if a new problem develops soon after your last Pap test. In these cases, your caregiver may  recommend more frequent screening and Pap tests.  The above recommendations are the same for women who have or have not gotten the vaccine for human papillomavirus (HPV).  If you had a hysterectomy for a problem that was not cancer or a condition that could lead to cancer, then you no longer need Pap tests. Even if you no longer need a Pap test, a regular exam is a good idea to make sure no other problems are starting.  If you are between ages 39 and 60, and you have had normal Pap tests going back 10 years, you no longer need Pap tests. Even if you no longer need a Pap test, a regular exam is a good idea to make sure no other problems are starting.  If you have had past treatment for cervical cancer or a condition that could lead to cancer, you need Pap tests and screening for cancer for at least 20 years after your treatment.  If Pap tests have been discontinued, risk factors (such as a new sexual partner) need to be reassessed to determine if screening should be resumed.  The HPV test is an additional test that may be used for cervical cancer screening. The HPV test looks for the virus that can cause the cell changes on the cervix. The cells collected during the Pap test can be tested for HPV. The HPV test could be used to screen women aged 21 years and older, and should be used in women of any age who have unclear Pap test results. After the age of 86, women should have HPV testing at the same frequency as a Pap test.  Colorectal cancer can be detected and often prevented. Most routine colorectal cancer screening begins at the age of 29 and continues through age 53. However, your caregiver may recommend screening at an earlier age if you have risk factors for colon cancer. On a yearly basis, your caregiver may provide home test kits to check for hidden blood in the stool. Use of a small camera at the end of a tube, to directly examine the colon (sigmoidoscopy or colonoscopy), can detect the  earliest forms of colorectal cancer. Talk to your caregiver about this at age 77, when routine screening begins. Direct examination of the colon should be repeated every 5 to 10 years through age 10, unless early forms of pre-cancerous polyps or small growths are found.  Hepatitis C blood testing is recommended for all people born from [birth year] through 1965 and any individual with known risks for hepatitis C.  Practice safe sex. Use condoms and avoid high-risk sexual practices to reduce the spread of sexually transmitted infections (STIs). STIs include gonorrhea, chlamydia, syphilis, trichomonas, herpes, HPV, and human immunodeficiency virus (HIV). Herpes, HIV, and HPV are viral illnesses that have no cure. They can result in disability, cancer, and death. Sexually active women aged 3 and younger should be checked for chlamydia. Older women with new or multiple partners should also be tested for chlamydia. Testing for other STIs is recommended if you are sexually active and at increased risk.  Osteoporosis is a disease in which the bones lose minerals and strength with aging. This can result in serious bone fractures. The risk of osteoporosis can be identified using a bone density scan. Women ages 44 and over and women at risk for fractures or osteoporosis should discuss screening with their caregivers. Ask your caregiver whether you should take a calcium supplement or vitamin D to reduce the rate of osteoporosis.  Menopause can be associated with physical symptoms and risks. Hormone replacement therapy is available to decrease symptoms and risks. You should talk to your caregiver about whether hormone replacement therapy is right for you.  Use sunscreen with sun protection factor (SPF) of 30 or more. Apply sunscreen liberally and repeatedly throughout the day. You should seek shade when your shadow is shorter than you. Protect yourself by wearing long sleeves, pants, a wide-brimmed hat, and sunglasses year  round, whenever you are outdoors.  Once a month, do a whole body skin exam, using a mirror to look at the skin on your back. Notify your caregiver of new moles, moles that have irregular borders, moles that are larger than a pencil eraser, or moles that have changed in shape or color.  Stay current with required immunizations.  Influenza. You need a dose every fall (or winter). The composition of the flu vaccine changes each year, so being vaccinated once is not enough.  Pneumococcal polysaccharide. You need 1 to 2 doses if you smoke cigarettes or if you have certain chronic medical conditions. You need 1 dose at  age 67 (or older) if you have never been vaccinated.  Tetanus, diphtheria, pertussis (Tdap, Td). Get 1 dose of Tdap vaccine if you are younger than age 34, are over 46 and have contact with an infant, are a Research scientist (physical sciences), are pregnant, or simply want to be protected from whooping cough. After that, you need a Td booster dose every 10 years. Consult your caregiver if you have not had at least 3 tetanus and diphtheria-containing shots sometime in your life or have a deep or dirty wound.  HPV. You need this vaccine if you are a woman age 27 or younger. The vaccine is given in 3 doses over 6 months.  Measles, mumps, rubella (MMR). You need at least 1 dose of MMR if you were born in 1956-02-06 or later. You may also need a second dose.  Meningococcal. If you are age 54 to 22 and a first-year college student living in a residence hall, or have one of several medical conditions, you need to get vaccinated against meningococcal disease. You may also need additional booster doses.  Zoster (shingles). If you are age 2 or older, you should get this vaccine.  Varicella (chickenpox). If you have never had chickenpox or you were vaccinated but received only 1 dose, talk to your caregiver to find out if you need this vaccine.  Hepatitis A. You need this vaccine if you have a specific risk factor for  hepatitis A virus infection or you simply wish to be protected from this disease. The vaccine is usually given as 2 doses, 6 to 18 months apart.  Hepatitis B. You need this vaccine if you have a specific risk factor for hepatitis B virus infection or you simply wish to be protected from this disease. The vaccine is given in 3 doses, usually over 6 months. Preventive Services / Frequency Ages 65 to 49  Blood pressure check.** / Every 1 to 2 years.  Lipid and cholesterol check.** / Every 5 years beginning at age 4.  Clinical breast exam.** / Every 3 years for women in their 38s and 30s.  Pap test.** / Every 2 years from ages 2 through 81. Every 3 years starting at age 21 through age 94 or 2 with a history of 3 consecutive normal Pap tests.  HPV screening.** / Every 3 years from ages 37 through ages 2 to 32 with a history of 3 consecutive normal Pap tests.  Hepatitis C blood test.** / For any individual with known risks for hepatitis C.  Skin self-exam. / Monthly.  Influenza immunization.** / Every year.  Pneumococcal polysaccharide immunization.** / 1 to 2 doses if you smoke cigarettes or if you have certain chronic medical conditions.  Tetanus, diphtheria, pertussis (Tdap, Td) immunization. / A one-time dose of Tdap vaccine. After that, you need a Td booster dose every 10 years.  HPV immunization. / 3 doses over 6 months, if you are 17 and younger.  Measles, mumps, rubella (MMR) immunization. / You need at least 1 dose of MMR if you were born in 1956/07/05 or later. You may also need a second dose.  Meningococcal immunization. / 1 dose if you are age 28 to 27 and a first-year college student living in a residence hall, or have one of several medical conditions, you need to get vaccinated against meningococcal disease. You may also need additional booster doses.  Varicella immunization.** / Consult your caregiver.  Hepatitis A immunization.** / Consult your caregiver. 2 doses, 6 to 18  months apart.  Hepatitis B immunization.** / Consult your caregiver. 3 doses usually over 6 months. Ages 53 to 14  Blood pressure check.** / Every 1 to 2 years.  Lipid and cholesterol check.** / Every 5 years beginning at age 40.  Clinical breast exam.** / Every year after age 78.  Mammogram.** / Every year beginning at age 10 and continuing for as long as you are in good health. Consult with your caregiver.  Pap test.** / Every 3 years starting at age 19 through age 78 or 5 with a history of 3 consecutive normal Pap tests.  HPV screening.** / Every 3 years from ages 58 through ages 31 to 17 with a history of 3 consecutive normal Pap tests.  Fecal occult blood test (FOBT) of stool. / Every year beginning at age 50 and continuing until age 30. You may not need to do this test if you get a colonoscopy every 10 years.  Flexible sigmoidoscopy or colonoscopy.** / Every 5 years for a flexible sigmoidoscopy or every 10 years for a colonoscopy beginning at age 29 and continuing until age 27.  Hepatitis C blood test.** / For all people born from [birth year] through 1965 and any individual with known risks for hepatitis C.  Skin self-exam. / Monthly.  Influenza immunization.** / Every year.  Pneumococcal polysaccharide immunization.** / 1 to 2 doses if you smoke cigarettes or if you have certain chronic medical conditions.  Tetanus, diphtheria, pertussis (Tdap, Td) immunization.** / A one-time dose of Tdap vaccine. After that, you need a Td booster dose every 10 years.  Measles, mumps, rubella (MMR) immunization. / You need at least 1 dose of MMR if you were born in 03-18-1956 or later. You may also need a second dose.  Varicella immunization.** / Consult your caregiver.  Meningococcal immunization.** / Consult your caregiver.  Hepatitis A immunization.** / Consult your caregiver. 2 doses, 6 to 18 months apart.  Hepatitis B immunization.** / Consult your caregiver. 3 doses, usually over 6  months. Ages 15 and over  Blood pressure check.** / Every 1 to 2 years.  Lipid and cholesterol check.** / Every 5 years beginning at age 45.  Clinical breast exam.** / Every year after age 95.  Mammogram.** / Every year beginning at age 61 and continuing for as long as you are in good health. Consult with your caregiver.  Pap test.** / Every 3 years starting at age 55 through age 56 or 21 with a 3 consecutive normal Pap tests. Testing can be stopped between 65 and 70 with 3 consecutive normal Pap tests and no abnormal Pap or HPV tests in the past 10 years.  HPV screening.** / Every 3 years from ages 21 through ages 71 or 9 with a history of 3 consecutive normal Pap tests. Testing can be stopped between 65 and 70 with 3 consecutive normal Pap tests and no abnormal Pap or HPV tests in the past 10 years.  Fecal occult blood test (FOBT) of stool. / Every year beginning at age 14 and continuing until age 41. You may not need to do this test if you get a colonoscopy every 10 years.  Flexible sigmoidoscopy or colonoscopy.** / Every 5 years for a flexible sigmoidoscopy or every 10 years for a colonoscopy beginning at age 37 and continuing until age 57.  Hepatitis C blood test.** / For all people born from [birth year] through 1965 and any individual with known risks for hepatitis C.  Osteoporosis screening.** / A one-time screening for women ages  65 and over and women at risk for fractures or osteoporosis.  Skin self-exam. / Monthly.  Influenza immunization.** / Every year.  Pneumococcal polysaccharide immunization.** / 1 dose at age 75 (or older) if you have never been vaccinated.  Tetanus, diphtheria, pertussis (Tdap, Td) immunization. / A one-time dose of Tdap vaccine if you are over 65 and have contact with an infant, are a Research scientist (physical sciences), or simply want to be protected from whooping cough. After that, you need a Td booster dose every 10 years.  Varicella immunization.** / Consult your  caregiver.  Meningococcal immunization.** / Consult your caregiver.  Hepatitis A immunization.** / Consult your caregiver. 2 doses, 6 to 18 months apart.  Hepatitis B immunization.** / Check with your caregiver. 3 doses, usually over 6 months. ** Family history and personal history of risk and conditions may change your caregiver's recommendations. Document Released: 09/02/2001 Document Revised: 09/29/2011 Document Reviewed: 12/02/2010 [facility] Patient Information 2014 [place], [place].

## 2013-03-11 NOTE — Progress Notes (Signed)
Subjective:    Patient ID: Michele Adkins, female    DOB: [birth year], 57 y.o.   MRN: [id number]  HPI  She returns for a physical but also complains of worsening depression with fatigue, anhedonia, weight gain, overeating, crying spells, lack of motivation and poor concentration.   Review of Systems  Constitutional: Positive for activity change, appetite change, fatigue and unexpected weight change. Negative for fever, chills and diaphoresis.  HENT: Negative.   Eyes: Negative.   Respiratory: Negative.  Negative for apnea, cough, chest tightness, shortness of breath, wheezing and stridor.   Cardiovascular: Negative.  Negative for chest pain, palpitations and leg swelling.  Gastrointestinal: Negative.  Negative for abdominal pain.  Endocrine: Negative.   Genitourinary: Negative.   Musculoskeletal: Negative.   Skin: Negative.   Allergic/Immunologic: Negative.   Neurological: Negative.  Negative for dizziness, tremors, weakness, light-headedness and headaches.  Hematological: Negative.  Negative for adenopathy. Does not bruise/bleed easily.  Psychiatric/Behavioral: Positive for sleep disturbance and decreased concentration. Negative for suicidal ideas, hallucinations, behavioral problems, confusion, self-injury and agitation. The patient is nervous/anxious. The patient is not hyperactive.        Objective:   Physical Exam  Vitals reviewed. Constitutional: She is oriented to person, place, and time. She appears well-developed and well-nourished. No distress.  HENT:  Head: Normocephalic and atraumatic.  Mouth/Throat: Oropharynx is clear and moist. No oropharyngeal exudate.  Eyes: Conjunctivae are normal. Right eye exhibits no discharge. Left eye exhibits no discharge. No scleral icterus.  Neck: Normal range of motion. Neck supple. No JVD present. No tracheal deviation present. No thyromegaly present.  Cardiovascular: Normal rate, regular rhythm, normal heart sounds and intact distal  pulses.  Exam reveals no gallop and no friction rub.   No murmur heard. Pulmonary/Chest: Effort normal and breath sounds normal. No stridor. No respiratory distress. She has no wheezes. She has no rales. Chest wall is not dull to percussion. She exhibits no mass, no tenderness, no bony tenderness, no laceration, no crepitus, no edema, no deformity, no swelling and no retraction. Right breast exhibits no inverted nipple, no mass, no nipple discharge, no skin change and no tenderness. Left breast exhibits no inverted nipple, no mass, no nipple discharge, no skin change and no tenderness. Breasts are symmetrical.  Abdominal: Soft. Bowel sounds are normal. She exhibits no distension and no mass. There is no tenderness. There is no rebound and no guarding.  Musculoskeletal: Normal range of motion. She exhibits no edema and no tenderness.  Lymphadenopathy:    She has no cervical adenopathy.  Neurological: She is oriented to person, place, and time.  Skin: Skin is warm and dry. No rash noted. She is not diaphoretic. No erythema. No pallor.  Psychiatric: Her behavior is normal. Judgment and thought content normal. Her mood appears anxious. Her affect is angry. Her affect is not blunt, not labile and not inappropriate. Her speech is not rapid and/or pressured, not delayed, not tangential and not slurred. She is not agitated, not aggressive, not hyperactive, not slowed, not withdrawn, not actively hallucinating and not combative. Thought content is not paranoid and not delusional. Cognition and memory are normal. Cognition and memory are not impaired. She does not express impulsivity or inappropriate judgment. She exhibits a depressed mood. She expresses no homicidal and no suicidal ideation. She expresses no suicidal plans and no homicidal plans. She is communicative. She exhibits normal recent memory and normal remote memory.  She is tearful today She is attentive.     Lab Results  Component Value Date   WBC  6.2 03/09/2012   HGB 13.1 03/09/2012   HCT 39.8 03/09/2012   PLT 365.0 03/09/2012   GLUCOSE 89 03/09/2012   CHOL 208* 03/09/2012   TRIG 90.0 03/09/2012   HDL 55.70 03/09/2012   LDLDIRECT 136.1 03/09/2012   ALT 16 03/09/2012   AST 18 03/09/2012   NA 139 03/09/2012   K 4.6 03/09/2012   CL 102 03/09/2012   CREATININE 0.6 03/09/2012   BUN 10 03/09/2012   CO2 30 03/09/2012   TSH 2.07 03/09/2012   HGBA1C 5.9 03/09/2012       Assessment & Plan:

## 2013-03-13 ENCOUNTER — Encounter: Payer: Self-pay | Admitting: Internal Medicine

## 2013-03-13 NOTE — Assessment & Plan Note (Signed)
Exam done Labs ordered Vaccines were reviewed Pt ed material was given 

## 2013-03-13 NOTE — Assessment & Plan Note (Signed)
She will continue psychotherapy with Dola Factor and she will see psychiatry in the next month IN the meantime, I have asked her to add wellbutrin to the prozac to work on the DA and [place] receptors

## 2013-03-14 ENCOUNTER — Ambulatory Visit (INDEPENDENT_AMBULATORY_CARE_PROVIDER_SITE_OTHER): Payer: BC Managed Care – PPO

## 2013-03-14 DIAGNOSIS — Z0000 Encounter for general adult medical examination without abnormal findings: Secondary | ICD-10-CM

## 2013-03-14 DIAGNOSIS — Z79899 Other long term (current) drug therapy: Secondary | ICD-10-CM

## 2013-03-14 LAB — CBC WITH DIFFERENTIAL/PLATELET
Basophils Absolute: 0 10*3/uL (ref 0.0–0.1)
Basophils Relative: 0.4 % (ref 0.0–3.0)
Eosinophils Absolute: 0.1 10*3/uL (ref 0.0–0.7)
Eosinophils Relative: 2.6 % (ref 0.0–5.0)
HCT: 40.4 % (ref 36.0–46.0)
Hemoglobin: 13.7 g/dL (ref 12.0–15.0)
Lymphocytes Relative: 37.8 % (ref 12.0–46.0)
Lymphs Abs: 2 10*3/uL (ref 0.7–4.0)
MCHC: 33.9 g/dL (ref 30.0–36.0)
MCV: 90.2 fl (ref 78.0–100.0)
Monocytes Absolute: 0.4 10*3/uL (ref 0.1–1.0)
Monocytes Relative: 8.2 % (ref 3.0–12.0)
Neutro Abs: 2.7 10*3/uL (ref 1.4–7.7)
Neutrophils Relative %: 51 % (ref 43.0–77.0)
Platelets: 375 10*3/uL (ref 150.0–400.0)
RBC: 4.48 Mil/uL (ref 3.87–5.11)
RDW: 14.2 % (ref 11.5–14.6)
WBC: 5.4 10*3/uL (ref 4.5–10.5)

## 2013-03-14 LAB — COMPREHENSIVE METABOLIC PANEL
ALT: 31 U/L (ref 0–35)
AST: 24 U/L (ref 0–37)
Albumin: 3.8 g/dL (ref 3.5–5.2)
Alkaline Phosphatase: 76 U/L (ref 39–117)
BUN: 13 mg/dL (ref 6–23)
CO2: 31 mEq/L (ref 19–32)
Calcium: 9.4 mg/dL (ref 8.4–10.5)
Chloride: 104 mEq/L (ref 96–112)
Creatinine, Ser: 0.6 mg/dL (ref 0.4–1.2)
GFR: 109.4 mL/min (ref >60.00)
Glucose, Bld: 98 mg/dL (ref 70–99)
Potassium: 4.4 mEq/L (ref 3.5–5.1)
Sodium: 137 mEq/L (ref 135–145)
Total Bilirubin: 0.5 mg/dL (ref 0.3–1.2)
Total Protein: 7.2 g/dL (ref 6.0–8.3)

## 2013-03-14 LAB — LIPID PANEL
Cholesterol: 239 mg/dL — ABNORMAL HIGH (ref 0–200)
HDL: 55.2 mg/dL (ref >39.00)
Total CHOL/HDL Ratio: 4
Triglycerides: 127 mg/dL (ref 0.0–149.0)
VLDL: 25.4 mg/dL (ref 0.0–40.0)

## 2013-03-14 LAB — HEPATITIS C ANTIBODY: HCV Ab: NEGATIVE

## 2013-03-15 ENCOUNTER — Encounter: Payer: Self-pay | Admitting: Internal Medicine

## 2013-03-15 LAB — LDL CHOLESTEROL, DIRECT: Direct LDL: 171.5 mg/dL

## 2013-03-15 LAB — TSH: TSH: 2.95 u[IU]/mL (ref 0.35–5.50)

## 2013-03-30 ENCOUNTER — Other Ambulatory Visit: Payer: Self-pay | Admitting: Internal Medicine

## 2013-05-25 ENCOUNTER — Ambulatory Visit (INDEPENDENT_AMBULATORY_CARE_PROVIDER_SITE_OTHER): Payer: BC Managed Care – PPO | Admitting: Internal Medicine

## 2013-05-25 DIAGNOSIS — M069 Rheumatoid arthritis, unspecified: Secondary | ICD-10-CM

## 2013-05-26 ENCOUNTER — Other Ambulatory Visit: Payer: Self-pay

## 2013-06-08 LAB — LIPID PANEL
Cholesterol: 222 mg/dL — AB (ref 0–200)
HDL: 60 mg/dL (ref 35–70)
LDL Cholesterol: 140 mg/dL
LDl/HDL Ratio: 2.3
Triglycerides: 108 mg/dL (ref 40–160)

## 2013-06-08 LAB — HEPATIC FUNCTION PANEL
ALT: 37 U/L — AB (ref 7–35)
AST: 25 U/L (ref 13–35)
Alkaline Phosphatase: 94 U/L (ref 25–125)
Bilirubin, Total: 6.8 mg/dL

## 2013-06-08 LAB — HEMOGLOBIN A1C: Hgb A1c MFr Bld: 6.9 % — AB (ref 4.0–6.0)

## 2013-06-08 LAB — BASIC METABOLIC PANEL
BUN: 10 mg/dL (ref 4–21)
Creatinine: 0.7 mg/dL (ref 0.5–1.1)
Glucose: 98 mg/dL
Potassium: 4.6 mmol/L (ref 3.4–5.3)
Sodium: 139 mmol/L (ref 137–147)

## 2013-06-21 ENCOUNTER — Encounter: Payer: Self-pay | Admitting: Internal Medicine

## 2013-06-21 ENCOUNTER — Ambulatory Visit (INDEPENDENT_AMBULATORY_CARE_PROVIDER_SITE_OTHER): Payer: BC Managed Care – PPO | Admitting: Internal Medicine

## 2013-06-21 VITALS — BP 130/82 | HR 68 | Temp 97.7°F | Resp 16 | Ht 66.0 in | Wt 160.0 lb

## 2013-06-21 DIAGNOSIS — E785 Hyperlipidemia, unspecified: Secondary | ICD-10-CM

## 2013-06-21 DIAGNOSIS — IMO0001 Reserved for inherently not codable concepts without codable children: Secondary | ICD-10-CM

## 2013-06-21 MED ORDER — ROSUVASTATIN CALCIUM 10 MG PO TABS: 10.0000 mg | ORAL_TABLET | Freq: Every day | ORAL | Status: DC

## 2013-06-21 NOTE — Assessment & Plan Note (Signed)
She will start crestor for risk reduction

## 2013-06-21 NOTE — Progress Notes (Signed)
Pre visit review using our clinic review tool, if applicable. No additional management support is needed unless otherwise documented below in the visit note. 

## 2013-06-21 NOTE — Progress Notes (Signed)
Subjective:    Patient ID: Michele Adkins, female    DOB: Oct 18, 1955, 57 y.o.   MRN: [id number]  Diabetes She presents for her initial diabetic visit. She has type 2 diabetes mellitus. Her disease course has been stable. There are no hypoglycemic associated symptoms. Pertinent negatives for hypoglycemia include no dizziness or tremors. Pertinent negatives for diabetes include no blurred vision, no chest pain, no fatigue, no foot paresthesias, no foot ulcerations, no polydipsia, no polyphagia, no polyuria, no visual change, no weakness and no weight loss. There are no hypoglycemic complications. Symptoms are stable. There are no diabetic complications. Current diabetic treatment includes diet. She is compliant with treatment most of the time. Her weight is stable. She is following a generally healthy diet. Meal planning includes avoidance of concentrated sweets. She has not had a previous visit with a dietician. She participates in exercise intermittently. There is no change in her home blood glucose trend. An ACE inhibitor/angiotensin II receptor blocker is not being taken. She does not see a podiatrist.Eye exam is not current.      Review of Systems  Constitutional: Negative.  Negative for fever, chills, weight loss, diaphoresis, appetite change and fatigue.  HENT: Negative.   Eyes: Negative.  Negative for blurred vision.  Respiratory: Negative.  Negative for cough, chest tightness, shortness of breath, wheezing and stridor.   Cardiovascular: Negative.  Negative for chest pain, palpitations and leg swelling.  Gastrointestinal: Negative.  Negative for nausea, vomiting, abdominal pain, diarrhea, constipation and blood in stool.  Endocrine: Negative.  Negative for polydipsia, polyphagia and polyuria.  Genitourinary: Negative.   Musculoskeletal: Negative.   Skin: Negative.   Allergic/Immunologic: Negative.   Neurological: Negative.  Negative for dizziness, tremors, syncope, facial asymmetry,  weakness, light-headedness and numbness.  Hematological: Negative.  Negative for adenopathy. Does not bruise/bleed easily.  Psychiatric/Behavioral: Negative.        Objective:   Physical Exam  Vitals reviewed. Constitutional: She is oriented to person, place, and time. She appears well-developed and well-nourished. No distress.  HENT:  Head: Normocephalic and atraumatic.  Mouth/Throat: Oropharynx is clear and moist. No oropharyngeal exudate.  Eyes: Conjunctivae are normal. Right eye exhibits no discharge. Left eye exhibits no discharge. No scleral icterus.  Neck: Normal range of motion. Neck supple. No JVD present. No tracheal deviation present. No thyromegaly present.  Cardiovascular: Normal rate, regular rhythm, normal heart sounds and intact distal pulses.  Exam reveals no gallop and no friction rub.   No murmur heard. Pulmonary/Chest: Effort normal and breath sounds normal. No stridor. No respiratory distress. She has no wheezes. She has no rales. She exhibits no tenderness.  Abdominal: Soft. Bowel sounds are normal. She exhibits no distension and no mass. There is no tenderness. There is no rebound and no guarding.  Musculoskeletal: Normal range of motion. She exhibits no edema and no tenderness.  Lymphadenopathy:    She has no cervical adenopathy.  Neurological: She is oriented to person, place, and time.  Skin: Skin is warm and dry. No rash noted. She is not diaphoretic. No erythema. No pallor.  Psychiatric: She has a normal mood and affect. Her behavior is normal. Judgment and thought content normal.      Lab Results  Component Value Date   WBC 5.4 03/14/2013   HGB 13.7 03/14/2013   HCT 40.4 03/14/2013   PLT 375.0 03/14/2013   GLUCOSE 98 03/14/2013   CHOL 222* 06/08/2013   TRIG 108 06/08/2013   HDL 60 06/08/2013   LDLDIRECT 171.5  03/14/2013   LDLCALC 140 06/08/2013   ALT 37* 06/08/2013   AST 25 06/08/2013   NA 139 06/08/2013   K 4.6 06/08/2013   CL 104 03/14/2013    CREATININE 0.7 06/08/2013   BUN 10 06/08/2013   CO2 31 03/14/2013   TSH 2.95 03/14/2013   HGBA1C 6.9* 06/08/2013      Assessment & Plan:

## 2013-06-21 NOTE — Assessment & Plan Note (Signed)
She does not need a medication yet She will work on her lifestyle modifications and will see nutrition/diabetic education She is also due for an eye exam

## 2013-06-21 NOTE — Patient Instructions (Signed)
Type 2 Diabetes Mellitus, Adult Type 2 diabetes mellitus, often simply referred to as type 2 diabetes, is a long-lasting (chronic) disease. In type 2 diabetes, the pancreas does not make enough insulin (a hormone), the cells are less responsive to the insulin that is made (insulin resistance), or both. Normally, insulin moves sugars from food into the tissue cells. The tissue cells use the sugars for energy. The lack of insulin or the lack of normal response to insulin causes excess sugars to build up in the blood instead of going into the tissue cells. As a result, high blood sugar (hyperglycemia) develops. The effect of high sugar (glucose) levels can cause many complications. Type 2 diabetes was also previously called adult-onset diabetes but it can occur at any age.  RISK FACTORS  A person is predisposed to developing type 2 diabetes if someone in the family has the disease and also has one or more of the following primary risk factors:  Overweight.  An inactive lifestyle.  A history of consistently eating high-calorie foods. Maintaining a normal weight and regular physical activity can reduce the chance of developing type 2 diabetes. SYMPTOMS  A person with type 2 diabetes may not show symptoms initially. The symptoms of type 2 diabetes appear slowly. The symptoms include:  Increased thirst (polydipsia).  Increased urination (polyuria).  Increased urination during the night (nocturia).  Weight loss. This weight loss may be rapid.  Frequent, recurring infections.  Tiredness (fatigue).  Weakness.  Vision changes, such as blurred vision.  Fruity smell to your breath.  Abdominal pain.  Nausea or vomiting.  Cuts or bruises which are slow to heal.  Tingling or numbness in the hands or feet. DIAGNOSIS Type 2 diabetes is frequently not diagnosed until complications of diabetes are present. Type 2 diabetes is diagnosed when symptoms or complications are present and when blood  glucose levels are increased. Your blood glucose level may be checked by one or more of the following blood tests:  A fasting blood glucose test. You will not be allowed to eat for at least 8 hours before a blood sample is taken.  A random blood glucose test. Your blood glucose is checked at any time of the day regardless of when you ate.  A hemoglobin A1c blood glucose test. A hemoglobin A1c test provides information about blood glucose control over the previous 3 months.  An oral glucose tolerance test (OGTT). Your blood glucose is measured after you have not eaten (fasted) for 2 hours and then after you drink a glucose-containing beverage. TREATMENT   You may need to take insulin or diabetes medicine daily to keep blood glucose levels in the desired range.  You will need to match insulin dosing with exercise and healthy food choices. The treatment goal is to maintain the before meal blood sugar (preprandial glucose) level at 70 130 mg/dL. HOME CARE INSTRUCTIONS   Have your hemoglobin A1c level checked twice a year.  Perform daily blood glucose monitoring as directed by your caregiver.  Monitor urine ketones when you are ill and as directed by your caregiver.  Take your diabetes medicine or insulin as directed by your caregiver to maintain your blood glucose levels in the desired range.  Never run out of diabetes medicine or insulin. It is needed every day.  Adjust insulin based on your intake of carbohydrates. Carbohydrates can raise blood glucose levels but need to be included in your diet. Carbohydrates provide vitamins, minerals, and fiber which are an essential part of   a healthy diet. Carbohydrates are found in fruits, vegetables, whole grains, dairy products, legumes, and foods containing added sugars.    Eat healthy foods. Alternate 3 meals with 3 snacks.  Lose weight if overweight.  Carry a medical alert card or wear your medical alert jewelry.  Carry a 15 gram  carbohydrate snack with you at all times to treat low blood glucose (hypoglycemia). Some examples of 15 gram carbohydrate snacks include:  Glucose tablets, 3 or 4   Glucose gel, 15 gram tube  Raisins, 2 tablespoons (24 grams)  Jelly beans, 6  Animal crackers, 8  Regular pop, 4 ounces (120 mL)  Gummy treats, 9  Recognize hypoglycemia. Hypoglycemia occurs with blood glucose levels of 70 mg/dL and below. The risk for hypoglycemia increases when fasting or skipping meals, during or after intense exercise, and during sleep. Hypoglycemia symptoms can include:  Tremors or shakes.  Decreased ability to concentrate.  Sweating.  Increased heart rate.  Headache.  Dry mouth.  Hunger.  Irritability.  Anxiety.  Restless sleep.  Altered speech or coordination.  Confusion.  Treat hypoglycemia promptly. If you are alert and able to safely swallow, follow the 15:15 rule:  Take 15 20 grams of rapid-acting glucose or carbohydrate. Rapid-acting options include glucose gel, glucose tablets, or 4 ounces (120 mL) of fruit juice, regular soda, or low fat milk.  Check your blood glucose level 15 minutes after taking the glucose.  Take 15 20 grams more of glucose if the repeat blood glucose level is still 70 mg/dL or below.  Eat a meal or snack within 1 hour once blood glucose levels return to normal.    Be alert to polyuria and polydipsia which are early signs of hyperglycemia. An early awareness of hyperglycemia allows for prompt treatment. Treat hyperglycemia as directed by your caregiver.  Engage in at least 150 minutes of moderate-intensity physical activity a week, spread over at least 3 days of the week or as directed by your caregiver. In addition, you should engage in resistance exercise at least 2 times a week or as directed by your caregiver.  Adjust your medicine and food intake as needed if you start a new exercise or sport.  Follow your sick day plan at any time you  are unable to eat or drink as usual.  Avoid tobacco use.  Limit alcohol intake to no more than 1 drink per day for nonpregnant women and 2 drinks per day for men. You should drink alcohol only when you are also eating food. Talk with your caregiver whether alcohol is safe for you. Tell your caregiver if you drink alcohol several times a week.  Follow up with your caregiver regularly.  Schedule an eye exam soon after the diagnosis of type 2 diabetes and then annually.  Perform daily skin and foot care. Examine your skin and feet daily for cuts, bruises, redness, nail problems, bleeding, blisters, or sores. A foot exam by a caregiver should be done annually.  Brush your teeth and gums at least twice a day and floss at least once a day. Follow up with your dentist regularly.  Share your diabetes management plan with your workplace or school.  Stay up-to-date with immunizations.  Learn to manage stress.  Obtain ongoing diabetes education and support as needed.  Participate in, or seek rehabilitation as needed to maintain or improve independence and quality of life. Request a physical or occupational therapy referral if you are having foot or hand numbness or difficulties with grooming,   dressing, eating, or physical activity. SEEK MEDICAL CARE IF:   You are unable to eat food or drink fluids for more than 6 hours.  You have nausea and vomiting for more than 6 hours.  Your blood glucose level is over 240 mg/dL.  There is a change in mental status.  You develop an additional serious illness.  You have diarrhea for more than 6 hours.  You have been sick or have had a fever for a couple of days and are not getting better.  You have pain during any physical activity.  SEEK IMMEDIATE MEDICAL CARE IF:  You have difficulty breathing.  You have moderate to large ketone levels. MAKE SURE YOU:  Understand these instructions.  Will watch your condition.  Will get help right away if  you are not doing well or get worse. Document Released: 07/07/2005 Document Revised: 03/31/2012 Document Reviewed: 02/03/2012 [employer] Patient Information 2014 [employer].  

## 2013-07-08 ENCOUNTER — Telehealth: Payer: Self-pay

## 2013-07-08 MED ORDER — MOXIFLOXACIN HCL 400 MG PO TABS: 400.0000 mg | ORAL_TABLET | Freq: Every day | ORAL | Status: DC

## 2013-07-08 MED ORDER — PROMETHAZINE-DM 6.25-15 MG/5ML PO SYRP: 5.0000 mL | ORAL_SOLUTION | Freq: Four times a day (QID) | ORAL | Status: DC | PRN

## 2013-07-08 NOTE — Telephone Encounter (Signed)
The patient is hoping to get a cough medicine called in for her cold symptoms.  She was offered an ov, but states she is unable to come in.  Pt callback - [phone]

## 2013-07-08 NOTE — Telephone Encounter (Signed)
Recent fever, cough (brown mucous), chills, and swollen lymph nodes x monday, exposed grandson recently sick with URI. She states that original message should have stated her request for abx. She plans to visit her sister who is taking chemo and wants to take precautions. Thanks

## 2013-07-08 NOTE — Telephone Encounter (Signed)
done

## 2013-07-08 NOTE — Telephone Encounter (Signed)
Pt.notified

## 2013-07-27 ENCOUNTER — Encounter: Payer: BC Managed Care – PPO | Attending: Internal Medicine | Admitting: Dietician

## 2013-07-27 DIAGNOSIS — E1165 Type 2 diabetes mellitus with hyperglycemia: Principal | ICD-10-CM

## 2013-07-27 DIAGNOSIS — Z713 Dietary counseling and surveillance: Secondary | ICD-10-CM | POA: Insufficient documentation

## 2013-07-27 DIAGNOSIS — IMO0001 Reserved for inherently not codable concepts without codable children: Secondary | ICD-10-CM | POA: Insufficient documentation

## 2013-07-28 ENCOUNTER — Encounter: Payer: Self-pay | Admitting: Dietician

## 2013-07-28 NOTE — Progress Notes (Signed)
Patient was seen on 07/27/2013 for the first of a series of three diabetes self-management courses at the [facility].  Current HbA1c: 6.9 on 11/19  The following learning objectives were met by the patient during this class:  Describe diabetes  State some common risk factors for diabetes  Defines the role of glucose and insulin  Identifies type of diabetes and pathophysiology  Describe the relationship between diabetes and cardiovascular risk  State the members of the Healthcare Team  States the rationale for glucose monitoring  State when to test glucose  State their individual Target Range  State the importance of logging glucose readings  Describe how to interpret glucose readings  Identifies A1C target  Explain the correlation between A1c and eAG values  State symptoms and treatment of high blood glucose  State symptoms and treatment of low blood glucose  Explain proper technique for glucose testing  Identifies proper sharps disposal  Handouts given during class include:  Living Well with Diabetes book  Carb Counting and Meal Planning book  Meal Plan Card  Carbohydrate guide  Meal planning worksheet  Low Sodium Flavoring Tips  The diabetes portion plate  Low Carbohydrate Snack Suggestions  A1c to eAG Conversion Chart  Diabetes Medications  Stress Management  Diabetes Recommended Care Schedule  Diabetes Success Plan  Core Class Satisfaction Survey  Your patient has identified their diabetes care support plan as:  [facility]  Staff  Follow-Up Plan:  Attend core 2

## 2013-07-28 NOTE — Patient Instructions (Signed)
Goals:  Monitor glucose levels as instructed by your doctor  Bring food record and glucose log to your next nutrition visit 

## 2013-08-02 LAB — HM MAMMOGRAPHY: HM Mammogram: NORMAL

## 2013-08-03 DIAGNOSIS — E1165 Type 2 diabetes mellitus with hyperglycemia: Principal | ICD-10-CM

## 2013-08-03 DIAGNOSIS — IMO0001 Reserved for inherently not codable concepts without codable children: Secondary | ICD-10-CM

## 2013-08-03 NOTE — Progress Notes (Signed)

## 2013-08-10 ENCOUNTER — Ambulatory Visit: Payer: BC Managed Care – PPO

## 2013-08-26 ENCOUNTER — Encounter: Payer: BC Managed Care – PPO | Attending: Internal Medicine

## 2013-08-26 DIAGNOSIS — IMO0001 Reserved for inherently not codable concepts without codable children: Secondary | ICD-10-CM | POA: Insufficient documentation

## 2013-08-26 DIAGNOSIS — Z713 Dietary counseling and surveillance: Secondary | ICD-10-CM | POA: Insufficient documentation

## 2013-08-26 DIAGNOSIS — E1165 Type 2 diabetes mellitus with hyperglycemia: Principal | ICD-10-CM

## 2013-08-30 NOTE — Progress Notes (Signed)
Patient was seen on 08/26/13 for the third of a series of three diabetes self-management courses at the [facility]. The following learning objectives were met by the patient during this class:    State the amount of activity recommended for healthy living   Describe activities suitable for individual needs   Identify ways to regularly incorporate activity into daily life   Identify barriers to activity and ways to over come these barriers  Identify diabetes medications being personally used and their primary action for lowering glucose and possible side effects   Describe role of stress on blood glucose and develop strategies to address psychosocial issues   Identify diabetes complications and ways to prevent them  Explain how to manage diabetes during illness   Evaluate success in meeting personal goal   Establish 2-3 goals that they will plan to diligently work on until they return for the  10-monthfollow-up visit  Goals:  Follow Diabetes Meal Plan as instructed  Aim for 15-30 mins of physical activity daily as tolerated  Bring food record and glucose log to your follow up visit  Your patient has established the following 4 month goals in their individualized success plan:  Count carbohydrates at most meals and snacks  Increase activity to at least 5 days a week  Be intentional about mindfulness to reduce stress  Your patient has identified these potential barriers to change:  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

## 2013-09-07 ENCOUNTER — Ambulatory Visit (INDEPENDENT_AMBULATORY_CARE_PROVIDER_SITE_OTHER)
Admission: RE | Admit: 2013-09-07 | Discharge: 2013-09-07 | Disposition: A | Discharge status: Home / Self Care | Payer: Self-pay | Source: Ambulatory Visit | Attending: Cardiovascular Disease | Admitting: Cardiovascular Disease

## 2013-09-07 ENCOUNTER — Ambulatory Visit: Payer: BC Managed Care – PPO | Admitting: Cardiovascular Disease

## 2013-09-07 ENCOUNTER — Other Ambulatory Visit: Payer: Self-pay | Admitting: *Deleted

## 2013-09-07 DIAGNOSIS — E785 Hyperlipidemia, unspecified: Secondary | ICD-10-CM

## 2013-09-14 ENCOUNTER — Telehealth: Payer: Self-pay | Admitting: *Deleted

## 2013-09-14 NOTE — Telephone Encounter (Signed)
Patient phoned requesting crestor samples.  Two months worth awaiting p/u and patient notified.

## 2013-12-19 ENCOUNTER — Telehealth: Payer: Self-pay | Admitting: Internal Medicine

## 2013-12-19 ENCOUNTER — Ambulatory Visit: Payer: BC Managed Care – PPO

## 2013-12-19 DIAGNOSIS — E785 Hyperlipidemia, unspecified: Secondary | ICD-10-CM

## 2013-12-19 DIAGNOSIS — E1165 Type 2 diabetes mellitus with hyperglycemia: Principal | ICD-10-CM

## 2013-12-19 DIAGNOSIS — IMO0001 Reserved for inherently not codable concepts without codable children: Secondary | ICD-10-CM

## 2013-12-19 MED ORDER — ROSUVASTATIN CALCIUM 10 MG PO TABS: 10.0000 mg | ORAL_TABLET | Freq: Every day | ORAL | Status: DC

## 2013-12-19 NOTE — Telephone Encounter (Signed)
Pt.notified

## 2013-12-19 NOTE — Telephone Encounter (Signed)
PT CAME IN TO REQUEST SAMPLES FOR CRESTOR. PT STATES SHE IS CUURENTLY OUT OF THE LAST PROVIDED SAMPLES. PT WANT DR Ronnald Ramp TO KNOW THAT SHE JUST HAD ROUTINE LABS PERFORMED AND WILL HAVE THE RESULTS SENT OVER FOR REVIEW. PLEASE CONTACT PT WHEN REQUEST FOR SAMPLES IS COMPLETED.

## 2014-01-18 ENCOUNTER — Telehealth: Payer: Self-pay | Admitting: *Deleted

## 2014-01-18 NOTE — Telephone Encounter (Signed)
Notified pt with md response. Made appt for 01/26/14...Johny Chess

## 2014-01-18 NOTE — Telephone Encounter (Signed)
Left msg wanting to come in to have blood work done. She has a physical schedule in September. Leaving going out of the country for 2 months been very thirsty lately wanting to have a A1C & cholesterol check before...Johny Chess

## 2014-01-18 NOTE — Telephone Encounter (Signed)
Needs to be seen

## 2014-01-26 ENCOUNTER — Encounter: Payer: Self-pay | Admitting: Internal Medicine

## 2014-01-26 ENCOUNTER — Other Ambulatory Visit (INDEPENDENT_AMBULATORY_CARE_PROVIDER_SITE_OTHER): Payer: BC Managed Care – PPO

## 2014-01-26 ENCOUNTER — Ambulatory Visit (INDEPENDENT_AMBULATORY_CARE_PROVIDER_SITE_OTHER): Payer: BC Managed Care – PPO | Admitting: Internal Medicine

## 2014-01-26 VITALS — BP 120/80 | HR 65 | Temp 98.4°F | Resp 16 | Ht 66.0 in | Wt 157.0 lb

## 2014-01-26 DIAGNOSIS — F5105 Insomnia due to other mental disorder: Secondary | ICD-10-CM

## 2014-01-26 DIAGNOSIS — E1165 Type 2 diabetes mellitus with hyperglycemia: Principal | ICD-10-CM

## 2014-01-26 DIAGNOSIS — IMO0001 Reserved for inherently not codable concepts without codable children: Secondary | ICD-10-CM | POA: Diagnosis not present

## 2014-01-26 DIAGNOSIS — F341 Dysthymic disorder: Secondary | ICD-10-CM | POA: Diagnosis not present

## 2014-01-26 DIAGNOSIS — E785 Hyperlipidemia, unspecified: Secondary | ICD-10-CM

## 2014-01-26 DIAGNOSIS — F489 Nonpsychotic mental disorder, unspecified: Secondary | ICD-10-CM

## 2014-01-26 DIAGNOSIS — F409 Phobic anxiety disorder, unspecified: Secondary | ICD-10-CM | POA: Diagnosis not present

## 2014-01-26 DIAGNOSIS — N951 Menopausal and female climacteric states: Secondary | ICD-10-CM

## 2014-01-26 DIAGNOSIS — Z23 Encounter for immunization: Secondary | ICD-10-CM | POA: Diagnosis not present

## 2014-01-26 DIAGNOSIS — F418 Other specified anxiety disorders: Secondary | ICD-10-CM

## 2014-01-26 LAB — COMPREHENSIVE METABOLIC PANEL
ALT: 31 U/L (ref 0–35)
AST: 25 U/L (ref 0–37)
Albumin: 4 g/dL (ref 3.5–5.2)
Alkaline Phosphatase: 70 U/L (ref 39–117)
BUN: 11 mg/dL (ref 6–23)
CO2: 28 mEq/L (ref 19–32)
Calcium: 9.7 mg/dL (ref 8.4–10.5)
Chloride: 100 mEq/L (ref 96–112)
Creatinine, Ser: 0.6 mg/dL (ref 0.4–1.2)
GFR: 120.59 mL/min (ref >60.00)
Glucose, Bld: 102 mg/dL — ABNORMAL HIGH (ref 70–99)
Potassium: 4.1 mEq/L (ref 3.5–5.1)
Sodium: 136 mEq/L (ref 135–145)
Total Bilirubin: 0.6 mg/dL (ref 0.2–1.2)
Total Protein: 7.3 g/dL (ref 6.0–8.3)

## 2014-01-26 LAB — LIPID PANEL
Cholesterol: 156 mg/dL (ref 0–200)
HDL: 63.1 mg/dL (ref >39.00)
LDL Cholesterol: 75 mg/dL (ref 0–99)
NonHDL: 92.9
Total CHOL/HDL Ratio: 2
Triglycerides: 92 mg/dL (ref 0.0–149.0)
VLDL: 18.4 mg/dL (ref 0.0–40.0)

## 2014-01-26 LAB — HEMOGLOBIN A1C: Hgb A1c MFr Bld: 6.1 % (ref 4.6–6.5)

## 2014-01-26 MED ORDER — ESTRADIOL 0.1 MG/GM VA CREA: 1.0000 | TOPICAL_CREAM | Freq: Every day | VAGINAL | Status: DC

## 2014-01-26 MED ORDER — FLUOXETINE HCL 40 MG PO CAPS: ORAL_CAPSULE | ORAL | Status: DC

## 2014-01-26 MED ORDER — DIAZEPAM 5 MG PO TABS: 5.0000 mg | ORAL_TABLET | Freq: Two times a day (BID) | ORAL | Status: DC | PRN

## 2014-01-26 MED ORDER — ROSUVASTATIN CALCIUM 10 MG PO TABS: 10.0000 mg | ORAL_TABLET | Freq: Every day | ORAL | Status: DC

## 2014-01-26 MED ORDER — DOXEPIN HCL 10 MG PO CAPS: 10.0000 mg | ORAL_CAPSULE | Freq: Every day | ORAL | Status: DC

## 2014-01-26 MED ORDER — METHYLPREDNISOLONE 4 MG PO KIT: PACK | ORAL | Status: DC

## 2014-01-26 NOTE — Progress Notes (Signed)
Subjective:    Patient ID: Michele Adkins, female    DOB: [birth year], 58 y.o.   MRN: [id number]  Diabetes She presents for her follow-up diabetic visit. She has type 2 diabetes mellitus. Her disease course has been stable. Hypoglycemia symptoms include nervousness/anxiousness. Pertinent negatives for hypoglycemia include no confusion, dizziness, headaches, hunger, mood changes, pallor, seizures, sleepiness, speech difficulty, sweats or tremors. Associated symptoms include polydipsia. Pertinent negatives for diabetes include no blurred vision, no chest pain, no fatigue, no foot paresthesias, no foot ulcerations, no polyphagia, no polyuria, no visual change, no weakness and no weight loss. Symptoms are stable. Current diabetic treatment includes diet. She is compliant with treatment most of the time. Her weight is stable. She is following a generally healthy diet. Meal planning includes avoidance of concentrated sweets. She participates in exercise intermittently. There is no change in her home blood glucose trend. An ACE inhibitor/angiotensin II receptor blocker is not being taken. She does not see a podiatrist.Eye exam is not current.      Review of Systems  Constitutional: Negative.  Negative for fever, chills, weight loss, diaphoresis, activity change, appetite change, fatigue and unexpected weight change.  HENT: Negative.   Eyes: Negative.  Negative for blurred vision.  Respiratory: Negative.  Negative for cough, choking, chest tightness, shortness of breath, wheezing and stridor.   Cardiovascular: Negative.  Negative for chest pain, palpitations and leg swelling.  Gastrointestinal: Negative.  Negative for nausea, vomiting, abdominal pain, diarrhea, constipation and blood in stool.  Endocrine: Positive for polydipsia. Negative for cold intolerance, heat intolerance, polyphagia and polyuria.  Genitourinary: Negative.        She complains of vaginal dryness  Musculoskeletal: Negative.  Negative  for arthralgias, back pain, gait problem, joint swelling, myalgias, neck pain and neck stiffness.  Skin: Negative.  Negative for pallor and rash.  Allergic/Immunologic: Negative.   Neurological: Negative.  Negative for dizziness, tremors, seizures, speech difficulty, weakness and headaches.  Hematological: Negative.  Negative for adenopathy. Does not bruise/bleed easily.  Psychiatric/Behavioral: Positive for sleep disturbance (DFA, FA) and dysphoric mood. Negative for suicidal ideas, hallucinations, behavioral problems, confusion, self-injury, decreased concentration and agitation. The patient is nervous/anxious. The patient is not hyperactive.        She has been taking her mother's valium       Objective:   Physical Exam  Vitals reviewed. Constitutional: She is oriented to person, place, and time. She appears well-developed and well-nourished. No distress.  HENT:  Head: Normocephalic and atraumatic.  Mouth/Throat: Oropharynx is clear and moist. No oropharyngeal exudate.  Eyes: Conjunctivae are normal. Right eye exhibits no discharge. Left eye exhibits no discharge. No scleral icterus.  Neck: Normal range of motion. Neck supple. No JVD present. No tracheal deviation present. No thyromegaly present.  Cardiovascular: Normal rate, regular rhythm, normal heart sounds and intact distal pulses.  Exam reveals no gallop and no friction rub.   No murmur heard. Pulmonary/Chest: Effort normal and breath sounds normal. No stridor. No respiratory distress. She has no wheezes. She has no rales. She exhibits no tenderness.  Abdominal: Soft. Bowel sounds are normal. She exhibits no distension and no mass. There is no tenderness. There is no rebound and no guarding.  Musculoskeletal: Normal range of motion. She exhibits no edema and no tenderness.  Lymphadenopathy:    She has no cervical adenopathy.  Neurological: She is oriented to person, place, and time.  Skin: Skin is warm and dry. No rash noted. She  is not diaphoretic. No erythema.  No pallor.  Psychiatric: Her behavior is normal. Judgment and thought content normal. Her mood appears anxious. Her affect is not angry, not blunt, not labile and not inappropriate. Her speech is not rapid and/or pressured, not delayed, not tangential and not slurred. She is not agitated, not aggressive, not hyperactive, not slowed, not withdrawn, not actively hallucinating and not combative. Thought content is not paranoid and not delusional. Cognition and memory are normal. She exhibits a depressed mood. She expresses no homicidal and no suicidal ideation. She expresses no suicidal plans and no homicidal plans. She is communicative. She is attentive.     Lab Results  Component Value Date   WBC 5.4 03/14/2013   HGB 13.7 03/14/2013   HCT 40.4 03/14/2013   PLT 375.0 03/14/2013   GLUCOSE 98 03/14/2013   CHOL 222* 06/08/2013   TRIG 108 06/08/2013   HDL 60 06/08/2013   LDLDIRECT 171.5 03/14/2013   LDLCALC 140 06/08/2013   ALT 37* 06/08/2013   AST 25 06/08/2013   NA 139 06/08/2013   K 4.6 06/08/2013   CL 104 03/14/2013   CREATININE 0.7 06/08/2013   BUN 10 06/08/2013   CO2 31 03/14/2013   TSH 2.95 03/14/2013   HGBA1C 6.9* 06/08/2013       Assessment & Plan:

## 2014-01-26 NOTE — Progress Notes (Signed)
Pre visit review using our clinic review tool, if applicable. No additional management support is needed unless otherwise documented below in the visit note. 

## 2014-01-26 NOTE — Patient Instructions (Signed)
Type 2 Diabetes Mellitus, Adult Type 2 diabetes mellitus, often simply referred to as type 2 diabetes, is a long-lasting (chronic) disease. In type 2 diabetes, the pancreas does not make enough insulin (a hormone), the cells are less responsive to the insulin that is made (insulin resistance), or both. Normally, insulin moves sugars from food into the tissue cells. The tissue cells use the sugars for energy. The lack of insulin or the lack of normal response to insulin causes excess sugars to build up in the blood instead of going into the tissue cells. As a result, high blood sugar (hyperglycemia) develops. The effect of high sugar (glucose) levels can cause many complications. Type 2 diabetes was also previously called adult-onset diabetes but it can occur at any age.  RISK FACTORS  A person is predisposed to developing type 2 diabetes if someone in the family has the disease and also has one or more of the following primary risk factors:  Overweight.  An inactive lifestyle.  A history of consistently eating high-calorie foods. Maintaining a normal weight and regular physical activity can reduce the chance of developing type 2 diabetes. SYMPTOMS  A person with type 2 diabetes may not show symptoms initially. The symptoms of type 2 diabetes appear slowly. The symptoms include:  Increased thirst (polydipsia).  Increased urination (polyuria).  Increased urination during the night (nocturia).  Weight loss. This weight loss may be rapid.  Frequent, recurring infections.  Tiredness (fatigue).  Weakness.  Vision changes, such as blurred vision.  Fruity smell to your breath.  Abdominal pain.  Nausea or vomiting.  Cuts or bruises which are slow to heal.  Tingling or numbness in the hands or feet. DIAGNOSIS Type 2 diabetes is frequently not diagnosed until complications of diabetes are present. Type 2 diabetes is diagnosed when symptoms or complications are present and when blood  glucose levels are increased. Your blood glucose level may be checked by one or more of the following blood tests:  A fasting blood glucose test. You will not be allowed to eat for at least 8 hours before a blood sample is taken.  A random blood glucose test. Your blood glucose is checked at any time of the day regardless of when you ate.  A hemoglobin A1c blood glucose test. A hemoglobin A1c test provides information about blood glucose control over the previous 3 months.  An oral glucose tolerance test (OGTT). Your blood glucose is measured after you have not eaten (fasted) for 2 hours and then after you drink a glucose-containing beverage. TREATMENT   You may need to take insulin or diabetes medicine daily to keep blood glucose levels in the desired range.  If you use insulin, you may need to adjust the dosage depending on the carbohydrates that you eat with each meal or snack. The treatment goal is to maintain the before meal blood sugar (preprandial glucose) level at 70-130 mg/dL. HOME CARE INSTRUCTIONS   Have your hemoglobin A1c level checked twice a year.  Perform daily blood glucose monitoring as directed by your health care provider.  Monitor urine ketones when you are ill and as directed by your health care provider.  Take your diabetes medicine or insulin as directed by your health care provider to maintain your blood glucose levels in the desired range.  Never run out of diabetes medicine or insulin. It is needed every day.  If you are using insulin, you may need to adjust the amount of insulin given based on your intake   of carbohydrates. Carbohydrates can raise blood glucose levels but need to be included in your diet. Carbohydrates provide vitamins, minerals, and fiber which are an essential part of a healthy diet. Carbohydrates are found in fruits, vegetables, whole grains, dairy products, legumes, and foods containing added sugars.  Eat healthy foods. You should make an  appointment to see a registered dietitian to help you create an eating plan that is right for you.  Lose weight if overweight.  Carry a medical alert card or wear your medical alert jewelry.  Carry a 15 gram carbohydrate snack with you at all times to treat low blood glucose (hypoglycemia). Some examples of 15 gram carbohydrate snacks include:  Glucose tablets, 3 or 4  Raisins, 2 tablespoons (24 grams)  Jelly beans, 6  Animal crackers, 8  Regular pop, 4 ounces (120 mL)  Gummy treats, 9  Recognize hypoglycemia. Hypoglycemia occurs with blood glucose levels of 70 mg/dL and below. The risk for hypoglycemia increases when fasting or skipping meals, during or after intense exercise, and during sleep. Hypoglycemia symptoms can include:  Tremors or shakes.  Decreased ability to concentrate.  Sweating.  Increased heart rate.  Headache.  Dry mouth.  Hunger.  Irritability.  Anxiety.  Restless sleep.  Altered speech or coordination.  Confusion.  Treat hypoglycemia promptly. If you are alert and able to safely swallow, follow the 15:15 rule:  Take 15-20 grams of rapid-acting glucose or carbohydrate. Rapid-acting options include glucose gel, glucose tablets, or 4 ounces (120 mL) of fruit juice, regular soda, or low fat milk.  Check your blood glucose level 15 minutes after taking the glucose.  Take 15-20 grams more of glucose if the repeat blood glucose level is still 70 mg/dL or below.  Eat a meal or snack within 1 hour once blood glucose levels return to normal.  Be alert to feeling very thirsty and urinating more frequently than usual, which are early signs of hyperglycemia. An early awareness of hyperglycemia allows for prompt treatment. Treat hyperglycemia as directed by your health care provider.  Engage in at least 150 minutes of moderate-intensity physical activity a week, spread over at least 3 days of the week or as directed by your health care provider. In  addition, you should engage in resistance exercise at least 2 times a week or as directed by your health care provider.  Adjust your medicine and food intake as needed if you start a new exercise or sport.  Follow your sick day plan at any time you are unable to eat or drink as usual.  Avoid tobacco use.  Limit alcohol intake to no more than 1 drink per day for nonpregnant women and 2 drinks per day for men. You should drink alcohol only when you are also eating food. Talk with your health care provider whether alcohol is safe for you. Tell your health care provider if you drink alcohol several times a week.  Follow up with your health care provider regularly.  Schedule an eye exam soon after the diagnosis of type 2 diabetes and then annually.  Perform daily skin and foot care. Examine your skin and feet daily for cuts, bruises, redness, nail problems, bleeding, blisters, or sores. A foot exam by a health care provider should be done annually.  Brush your teeth and gums at least twice a day and floss at least once a day. Follow up with your dentist regularly.  Share your diabetes management plan with your workplace or school.  Stay up-to-date with   immunizations.  Learn to manage stress.  Obtain ongoing diabetes education and support as needed.  Participate in, or seek rehabilitation as needed to maintain or improve independence and quality of life. Request a physical or occupational therapy referral if you are having foot or hand numbness or difficulties with grooming, dressing, eating, or physical activity. SEEK MEDICAL CARE IF:   You are unable to eat food or drink fluids for more than 6 hours.  You have nausea and vomiting for more than 6 hours.  Your blood glucose level is over 240 mg/dL.  There is a change in mental status.  You develop an additional serious illness.  You have diarrhea for more than 6 hours.  You have been sick or have had a fever for a couple of days  and are not getting better.  You have pain during any physical activity.  SEEK IMMEDIATE MEDICAL CARE IF:  You have difficulty breathing.  You have moderate to large ketone levels. MAKE SURE YOU:  Understand these instructions.  Will watch your condition.  Will get help right away if you are not doing well or get worse. Document Released: 07/07/2005 Document Revised: 07/12/2013 Document Reviewed: 02/03/2012 [employer] Patient Information 2015 [employer]. This information is not intended to replace advice given to you by your health care provider. Make sure you discuss any questions you have with your health care provider.  

## 2014-01-27 ENCOUNTER — Other Ambulatory Visit: Payer: Self-pay | Admitting: Internal Medicine

## 2014-01-27 DIAGNOSIS — E785 Hyperlipidemia, unspecified: Secondary | ICD-10-CM

## 2014-01-27 MED ORDER — ESTROGENS, CONJUGATED 0.625 MG/GM VA CREA: 1.0000 | TOPICAL_CREAM | Freq: Every day | VAGINAL | Status: DC

## 2014-01-27 MED ORDER — ATORVASTATIN CALCIUM 20 MG PO TABS: 20.0000 mg | ORAL_TABLET | Freq: Every day | ORAL | Status: DC

## 2014-01-27 NOTE — Telephone Encounter (Signed)
Patient is calling to request a generic for the vaginal cream that was called in. She states that the Estradiol prescription that was sent to her pharmacy was $80 with her insurance. Patient says that she leaves the country Sunday so she would need it done today.  Patient also wants to pick-up 2 months of Crestor samples. Please advise.

## 2014-01-27 NOTE — Addendum Note (Signed)
Addended by: Janith Lima on: 01/27/2014 02:53 PM   Modules accepted: Orders, Medications

## 2014-01-27 NOTE — Assessment & Plan Note (Signed)
Her blood sugars are well controlled 

## 2014-01-27 NOTE — Assessment & Plan Note (Signed)
She has achieved her LDL goal 

## 2014-01-27 NOTE — Assessment & Plan Note (Signed)
She will cont valium for this, today I gave her her own Rx for this

## 2014-01-27 NOTE — Assessment & Plan Note (Signed)
She will try an estrogen vaginal cream

## 2014-01-27 NOTE — Telephone Encounter (Signed)
done

## 2014-01-27 NOTE — Assessment & Plan Note (Signed)
Cont prozac She will get her own Rx for valium today

## 2014-04-06 ENCOUNTER — Encounter: Payer: BC Managed Care – PPO | Admitting: Internal Medicine

## 2014-04-14 LAB — HM DIABETES EYE EXAM

## 2014-04-16 NOTE — Progress Notes (Signed)
   Subjective:    Patient ID: Michele Adkins, female    DOB: 04-10-1956, 58 y.o.   MRN: [id number]  HPI Not seen   Review of Systems     Objective:   Physical Exam        Assessment & Plan:

## 2014-05-08 ENCOUNTER — Encounter: Payer: Self-pay | Admitting: Internal Medicine

## 2014-05-29 ENCOUNTER — Other Ambulatory Visit (INDEPENDENT_AMBULATORY_CARE_PROVIDER_SITE_OTHER): Payer: BC Managed Care – PPO

## 2014-05-29 ENCOUNTER — Ambulatory Visit (INDEPENDENT_AMBULATORY_CARE_PROVIDER_SITE_OTHER): Payer: BC Managed Care – PPO | Admitting: Internal Medicine

## 2014-05-29 ENCOUNTER — Encounter: Payer: Self-pay | Admitting: Internal Medicine

## 2014-05-29 VITALS — BP 110/80 | HR 71 | Temp 98.5°F | Ht 66.0 in | Wt 165.0 lb

## 2014-05-29 DIAGNOSIS — E119 Type 2 diabetes mellitus without complications: Secondary | ICD-10-CM

## 2014-05-29 DIAGNOSIS — J301 Allergic rhinitis due to pollen: Secondary | ICD-10-CM

## 2014-05-29 DIAGNOSIS — A09 Infectious gastroenteritis and colitis, unspecified: Secondary | ICD-10-CM | POA: Insufficient documentation

## 2014-05-29 LAB — MICROALBUMIN / CREATININE URINE RATIO
Creatinine,U: 15.7 mg/dL
Microalb Creat Ratio: 1.3 mg/g (ref 0.0–30.0)
Microalb, Ur: 0.2 mg/dL (ref 0.0–1.9)

## 2014-05-29 LAB — CBC WITH DIFFERENTIAL/PLATELET
Basophils Absolute: 0.2 10*3/uL — ABNORMAL HIGH (ref 0.0–0.1)
Basophils Relative: 2.9 % (ref 0.0–3.0)
Eosinophils Absolute: 0.1 10*3/uL (ref 0.0–0.7)
Eosinophils Relative: 1.9 % (ref 0.0–5.0)
HCT: 40.4 % (ref 36.0–46.0)
Hemoglobin: 13.1 g/dL (ref 12.0–15.0)
Lymphocytes Relative: 30.5 % (ref 12.0–46.0)
Lymphs Abs: 1.7 10*3/uL (ref 0.7–4.0)
MCHC: 32.5 g/dL (ref 30.0–36.0)
MCV: 89.8 fl (ref 78.0–100.0)
Monocytes Absolute: 0.6 10*3/uL (ref 0.1–1.0)
Monocytes Relative: 10.1 % (ref 3.0–12.0)
Neutro Abs: 3.1 10*3/uL (ref 1.4–7.7)
Neutrophils Relative %: 54.6 % (ref 43.0–77.0)
Platelets: 438 10*3/uL — ABNORMAL HIGH (ref 150.0–400.0)
RBC: 4.5 Mil/uL (ref 3.87–5.11)
RDW: 14.4 % (ref 11.5–15.5)
WBC: 5.6 10*3/uL (ref 4.0–10.5)

## 2014-05-29 LAB — URINALYSIS, ROUTINE W REFLEX MICROSCOPIC
Bilirubin Urine: NEGATIVE
Hgb urine dipstick: NEGATIVE
Ketones, ur: NEGATIVE
Leukocytes, UA: NEGATIVE
Nitrite: NEGATIVE
RBC / HPF: NONE SEEN (ref 0–?)
Specific Gravity, Urine: <=1.005 — AB (ref 1.000–1.030)
Total Protein, Urine: NEGATIVE
Urine Glucose: NEGATIVE
Urobilinogen, UA: 0.2 (ref 0.0–1.0)
WBC, UA: NONE SEEN (ref 0–?)
pH: 6.5 (ref 5.0–8.0)

## 2014-05-29 LAB — COMPREHENSIVE METABOLIC PANEL
ALT: 38 U/L — ABNORMAL HIGH (ref 0–35)
AST: 30 U/L (ref 0–37)
Albumin: 3.3 g/dL — ABNORMAL LOW (ref 3.5–5.2)
Alkaline Phosphatase: 77 U/L (ref 39–117)
BUN: 12 mg/dL (ref 6–23)
CO2: 26 mEq/L (ref 19–32)
Calcium: 9.8 mg/dL (ref 8.4–10.5)
Chloride: 103 mEq/L (ref 96–112)
Creatinine, Ser: 0.6 mg/dL (ref 0.4–1.2)
GFR: 113.29 mL/min (ref >60.00)
Glucose, Bld: 96 mg/dL (ref 70–99)
Potassium: 4.7 mEq/L (ref 3.5–5.1)
Sodium: 140 mEq/L (ref 135–145)
Total Bilirubin: 0.4 mg/dL (ref 0.2–1.2)
Total Protein: 7.2 g/dL (ref 6.0–8.3)

## 2014-05-29 LAB — HEMOGLOBIN A1C: Hgb A1c MFr Bld: 6.2 % (ref 4.6–6.5)

## 2014-05-29 LAB — SEDIMENTATION RATE: Sed Rate: 31 mm/hr — ABNORMAL HIGH (ref 0–22)

## 2014-05-29 MED ORDER — BECLOMETHASONE DIPROPIONATE 80 MCG/ACT NA AERS: 4.0000 | INHALATION_SPRAY | Freq: Every day | NASAL | Status: DC

## 2014-05-29 MED ORDER — CETIRIZINE HCL 10 MG PO TABS: 10.0000 mg | ORAL_TABLET | Freq: Every day | ORAL | Status: DC

## 2014-05-29 NOTE — Assessment & Plan Note (Signed)
I will recheck her A1C and will monitor her renal function

## 2014-05-29 NOTE — Assessment & Plan Note (Signed)
Will start Zyrtec and Qnasl for this

## 2014-05-29 NOTE — Progress Notes (Signed)
Subjective:    Patient ID: Michele Adkins, female    DOB: [birth year], 58 y.o.   MRN: [id number]  Diarrhea  This is a recurrent problem. Episode onset: for 2.5 months. The problem occurs less than 2 times per day. The problem has been unchanged. The stool consistency is described as watery. The patient states that diarrhea does not awaken her from sleep. Associated symptoms include bloating and increased flatus. Pertinent negatives include no abdominal pain, arthralgias, chills, coughing, fever, headaches, myalgias, sweats, URI, vomiting or weight loss. Nothing aggravates the symptoms. Risk factors include travel to [place] area ([place] She has tried nothing for the symptoms. The treatment provided no relief. There is no history of bowel resection, inflammatory bowel disease, irritable bowel syndrome, malabsorption, a recent abdominal surgery or short gut syndrome.      Review of Systems  Constitutional: Negative.  Negative for fever, chills, weight loss, diaphoresis, activity change, appetite change, fatigue and unexpected weight change.  HENT: Positive for congestion, postnasal drip, rhinorrhea and sneezing. Negative for dental problem, facial swelling, nosebleeds, sinus pressure, sore throat, tinnitus, trouble swallowing and voice change.   Eyes: Negative.   Respiratory: Negative.  Negative for apnea, cough, choking, chest tightness, shortness of breath, wheezing and stridor.   Cardiovascular: Negative.  Negative for chest pain, palpitations and leg swelling.  Gastrointestinal: Positive for diarrhea, bloating and flatus. Negative for nausea, vomiting, abdominal pain, constipation, blood in stool, anal bleeding and rectal pain.  Endocrine: Negative.   Genitourinary: Negative.   Musculoskeletal: Negative.  Negative for myalgias, arthralgias and neck pain.  Skin: Negative.   Neurological: Negative.  Negative for dizziness and headaches.  Hematological: Negative.   Psychiatric/Behavioral:  Negative.        Objective:   Physical Exam  Constitutional: She is oriented to person, place, and time. She appears well-developed and well-nourished.  Non-toxic appearance. She does not have a sickly appearance. She does not appear ill. No distress.  HENT:  Head: Normocephalic and atraumatic.  Right Ear: Hearing, tympanic membrane, external ear and ear canal normal.  Left Ear: Hearing, tympanic membrane, external ear and ear canal normal.  Nose: Mucosal edema present. No rhinorrhea, nose lacerations, sinus tenderness, nasal deformity, septal deviation or nasal septal hematoma. No epistaxis.  No foreign bodies. Right sinus exhibits no maxillary sinus tenderness and no frontal sinus tenderness. Left sinus exhibits no maxillary sinus tenderness and no frontal sinus tenderness.  Mouth/Throat: Oropharynx is clear and moist and mucous membranes are normal. Mucous membranes are not pale, not dry and not cyanotic. No oral lesions. No trismus in the jaw. No uvula swelling. No oropharyngeal exudate, posterior oropharyngeal edema, posterior oropharyngeal erythema or tonsillar abscesses.  Eyes: Conjunctivae are normal. Right eye exhibits no discharge. Left eye exhibits no discharge. No scleral icterus.  Neck: Normal range of motion. Neck supple. No JVD present. No tracheal deviation present. No thyromegaly present.  Cardiovascular: Normal rate, regular rhythm, normal heart sounds and intact distal pulses.  Exam reveals no gallop and no friction rub.   No murmur heard. Pulmonary/Chest: Effort normal and breath sounds normal. No stridor. No respiratory distress. She has no wheezes. She has no rales. She exhibits no tenderness.  Abdominal: Soft. Normal appearance and bowel sounds are normal. She exhibits no distension and no mass. There is no hepatosplenomegaly, splenomegaly or hepatomegaly. There is no tenderness. There is no rigidity, no rebound, no guarding, no CVA tenderness, no tenderness at McBurney's  point and negative Murphy's sign.  Musculoskeletal: Normal range of  motion. She exhibits no edema or tenderness.  Lymphadenopathy:    She has no cervical adenopathy.  Neurological: She is oriented to person, place, and time.  Skin: Skin is warm and dry. No rash noted. She is not diaphoretic. No erythema. No pallor.  Vitals reviewed.    Lab Results  Component Value Date   WBC 5.4 03/14/2013   HGB 13.7 03/14/2013   HCT 40.4 03/14/2013   PLT 375.0 03/14/2013   GLUCOSE 102* 01/26/2014   CHOL 156 01/26/2014   TRIG 92.0 01/26/2014   HDL 63.10 01/26/2014   LDLDIRECT 171.5 03/14/2013   LDLCALC 75 01/26/2014   ALT 31 01/26/2014   AST 25 01/26/2014   NA 136 01/26/2014   K 4.1 01/26/2014   CL 100 01/26/2014   CREATININE 0.6 01/26/2014   BUN 11 01/26/2014   CO2 28 01/26/2014   TSH 2.95 03/14/2013   HGBA1C 6.1 01/26/2014       Assessment & Plan:

## 2014-05-29 NOTE — Patient Instructions (Signed)

## 2014-05-29 NOTE — Progress Notes (Signed)
Pre visit review using our clinic review tool, if applicable. No additional management support is needed unless otherwise documented below in the visit note. 

## 2014-05-29 NOTE — Assessment & Plan Note (Signed)
She has recently traveled to [place] so will check stool studies for infection, Will get a CBC done to look for secondary signs of infection, allergy, anemia, etc Will get a CMP to see if there is liver involvement and an ESR to screen for IBD\ For now, will not start an antibiotic, she agrees to start a probiotic

## 2014-05-30 ENCOUNTER — Other Ambulatory Visit: Payer: Self-pay | Admitting: Internal Medicine

## 2014-05-30 ENCOUNTER — Encounter: Payer: Self-pay | Admitting: Internal Medicine

## 2014-05-30 ENCOUNTER — Other Ambulatory Visit: Payer: BC Managed Care – PPO

## 2014-05-30 DIAGNOSIS — A09 Infectious gastroenteritis and colitis, unspecified: Secondary | ICD-10-CM

## 2014-05-30 LAB — GLIADIN ANTIBODIES, SERUM
Gliadin IgA: 6.3 U/mL (ref <20)
Gliadin IgG: 6.7 U/mL (ref <20)

## 2014-05-30 LAB — RETICULIN ANTIBODIES, IGA W TITER: Reticulin Ab, IgA: NEGATIVE

## 2014-05-30 LAB — TISSUE TRANSGLUTAMINASE, IGA: Tissue Transglutaminase Ab, IgA: 5.1 U/mL (ref <20)

## 2014-05-31 LAB — GIARDIA/CRYPTOSPORIDIUM (EIA)
Cryptosporidium Screen (EIA): NEGATIVE
Giardia Screen (EIA): NEGATIVE

## 2014-05-31 LAB — C. DIFFICILE GDH AND TOXIN A/B
C. difficile GDH: NOT DETECTED
C. difficile Toxin A/B: NOT DETECTED

## 2014-05-31 LAB — FECAL LACTOFERRIN, QUANT: Lactoferrin: NEGATIVE

## 2014-05-31 LAB — OVA AND PARASITE EXAMINATION: OP: NONE SEEN

## 2014-06-01 ENCOUNTER — Encounter: Payer: Self-pay | Admitting: Internal Medicine

## 2014-06-03 LAB — STOOL CULTURE

## 2014-06-26 ENCOUNTER — Encounter: Payer: Self-pay | Admitting: Internal Medicine

## 2014-07-06 ENCOUNTER — Encounter: Payer: Self-pay | Admitting: Internal Medicine

## 2014-07-07 ENCOUNTER — Other Ambulatory Visit: Payer: Self-pay | Admitting: Internal Medicine

## 2014-07-07 DIAGNOSIS — F5105 Insomnia due to other mental disorder: Principal | ICD-10-CM

## 2014-07-07 DIAGNOSIS — F409 Phobic anxiety disorder, unspecified: Secondary | ICD-10-CM

## 2014-07-07 MED ORDER — SUVOREXANT 15 MG PO TABS: 1.0000 | ORAL_TABLET | Freq: Every evening | ORAL | Status: DC | PRN

## 2014-11-16 ENCOUNTER — Encounter: Payer: Self-pay | Admitting: Internal Medicine

## 2014-11-16 ENCOUNTER — Ambulatory Visit (INDEPENDENT_AMBULATORY_CARE_PROVIDER_SITE_OTHER): Payer: BLUE CROSS/BLUE SHIELD | Admitting: Internal Medicine

## 2014-11-16 VITALS — BP 118/78 | HR 68 | Temp 98.8°F | Resp 20 | Ht 66.0 in | Wt 162.0 lb

## 2014-11-16 DIAGNOSIS — M8580 Other specified disorders of bone density and structure, unspecified site: Secondary | ICD-10-CM | POA: Diagnosis not present

## 2014-11-16 DIAGNOSIS — E119 Type 2 diabetes mellitus without complications: Secondary | ICD-10-CM

## 2014-11-16 DIAGNOSIS — E785 Hyperlipidemia, unspecified: Secondary | ICD-10-CM | POA: Diagnosis not present

## 2014-11-16 DIAGNOSIS — F5105 Insomnia due to other mental disorder: Secondary | ICD-10-CM | POA: Diagnosis not present

## 2014-11-16 DIAGNOSIS — F409 Phobic anxiety disorder, unspecified: Secondary | ICD-10-CM

## 2014-11-16 MED ORDER — ESZOPICLONE 3 MG PO TABS: 3.0000 mg | ORAL_TABLET | Freq: Every day | ORAL | Status: DC

## 2014-11-16 NOTE — Assessment & Plan Note (Signed)
She is doing well with her lifestyle modifications Will recheck her A1C and will treat if indicated

## 2014-11-16 NOTE — Patient Instructions (Signed)

## 2014-11-16 NOTE — Assessment & Plan Note (Signed)
She is doing well on the statin Will recheck her FLP and TSH She tells me that her rheum just did a CBC and CMP so she did not want me to order that today

## 2014-11-16 NOTE — Assessment & Plan Note (Signed)
I will check her Vit D level and will treat if indicated

## 2014-11-16 NOTE — Assessment & Plan Note (Signed)
Belsomra was too expensive Doxepin caused urinary retention Will try lunesta

## 2014-11-16 NOTE — Progress Notes (Signed)
Subjective:    Patient ID: Michele Adkins, female    DOB: February 18, 1956, 59 y.o.   MRN: [id number]  Hyperlipidemia This is a chronic problem. The current episode started more than 1 year ago. The problem is controlled. Recent lipid tests were reviewed and are variable. Exacerbating diseases include diabetes. She has no history of chronic renal disease, hypothyroidism, liver disease, obesity or nephrotic syndrome. Factors aggravating her hyperlipidemia include fatty foods. Pertinent negatives include no chest pain, focal sensory loss, focal weakness, leg pain, myalgias or shortness of breath. Current antihyperlipidemic treatment includes statins. The current treatment provides significant improvement of lipids. There are no compliance problems.       Review of Systems  Constitutional: Negative.  Negative for fever, chills, diaphoresis, appetite change and fatigue.  HENT: Negative.   Eyes: Negative.   Respiratory: Negative.  Negative for cough, choking, chest tightness, shortness of breath and stridor.   Cardiovascular: Negative.  Negative for chest pain, palpitations and leg swelling.  Gastrointestinal: Negative.  Negative for nausea, vomiting, abdominal pain, diarrhea, constipation and blood in stool.  Endocrine: Negative.  Negative for polydipsia, polyphagia and polyuria.  Genitourinary: Negative.  Negative for difficulty urinating.  Musculoskeletal: Negative.  Negative for myalgias, back pain and arthralgias.  Skin: Negative.  Negative for rash.  Allergic/Immunologic: Negative.   Neurological: Negative.  Negative for dizziness, tremors, focal weakness, weakness, light-headedness and numbness.  Hematological: Negative.  Negative for adenopathy. Does not bruise/bleed easily.  Psychiatric/Behavioral: Positive for sleep disturbance. Negative for suicidal ideas, hallucinations, behavioral problems, confusion, self-injury, dysphoric mood, decreased concentration and agitation. The patient is  nervous/anxious. The patient is not hyperactive.        Objective:   Physical Exam  Constitutional: She is oriented to person, place, and time. She appears well-developed and well-nourished. No distress.  HENT:  Head: Normocephalic and atraumatic.  Mouth/Throat: Oropharynx is clear and moist. No oropharyngeal exudate.  Eyes: Conjunctivae are normal. Right eye exhibits no discharge. Left eye exhibits no discharge. No scleral icterus.  Neck: Normal range of motion. Neck supple. No JVD present. No tracheal deviation present. No thyromegaly present.  Cardiovascular: Normal rate, regular rhythm, normal heart sounds and intact distal pulses.  Exam reveals no gallop and no friction rub.   No murmur heard. Pulmonary/Chest: Effort normal and breath sounds normal. No stridor. No respiratory distress. She has no wheezes. She has no rales. She exhibits no tenderness.  Abdominal: Soft. Bowel sounds are normal. She exhibits no distension and no mass. There is no tenderness. There is no rebound and no guarding.  Musculoskeletal: Normal range of motion. She exhibits no edema or tenderness.  Lymphadenopathy:    She has no cervical adenopathy.  Neurological: She is oriented to person, place, and time.  Skin: Skin is warm and dry. No rash noted. She is not diaphoretic. No erythema. No pallor.  Psychiatric: She has a normal mood and affect. Her behavior is normal. Judgment and thought content normal.  Vitals reviewed.    Lab Results  Component Value Date   WBC 5.6 05/29/2014   HGB 13.1 05/29/2014   HCT 40.4 05/29/2014   PLT 438.0* 05/29/2014   GLUCOSE 96 05/29/2014   CHOL 156 01/26/2014   TRIG 92.0 01/26/2014   HDL 63.10 01/26/2014   LDLDIRECT 171.5 03/14/2013   LDLCALC 75 01/26/2014   ALT 38* 05/29/2014   AST 30 05/29/2014   NA 140 05/29/2014   K 4.7 05/29/2014   CL 103 05/29/2014   CREATININE 0.6 05/29/2014  BUN 12 05/29/2014   CO2 26 05/29/2014   TSH 2.95 03/14/2013   HGBA1C 6.2  05/29/2014   MICROALBUR 0.2 05/29/2014       Assessment & Plan:

## 2014-11-16 NOTE — Progress Notes (Signed)
Pre visit review using our clinic review tool, if applicable. No additional management support is needed unless otherwise documented below in the visit note. 

## 2014-11-17 ENCOUNTER — Telehealth: Payer: Self-pay | Admitting: Internal Medicine

## 2014-11-23 ENCOUNTER — Other Ambulatory Visit: Payer: Self-pay

## 2014-11-23 DIAGNOSIS — F418 Other specified anxiety disorders: Secondary | ICD-10-CM

## 2014-11-23 MED ORDER — FLUOXETINE HCL 40 MG PO CAPS: ORAL_CAPSULE | ORAL | Status: DC

## 2014-12-06 ENCOUNTER — Other Ambulatory Visit (INDEPENDENT_AMBULATORY_CARE_PROVIDER_SITE_OTHER): Payer: BLUE CROSS/BLUE SHIELD

## 2014-12-06 ENCOUNTER — Encounter: Payer: Self-pay | Admitting: Internal Medicine

## 2014-12-06 DIAGNOSIS — M8580 Other specified disorders of bone density and structure, unspecified site: Secondary | ICD-10-CM | POA: Diagnosis not present

## 2014-12-06 DIAGNOSIS — E119 Type 2 diabetes mellitus without complications: Secondary | ICD-10-CM

## 2014-12-06 DIAGNOSIS — E785 Hyperlipidemia, unspecified: Secondary | ICD-10-CM | POA: Diagnosis not present

## 2014-12-06 LAB — LIPID PANEL
Cholesterol: 161 mg/dL (ref 0–200)
HDL: 51.6 mg/dL (ref >39.00)
LDL Cholesterol: 90 mg/dL (ref 0–99)
NonHDL: 109.4
Total CHOL/HDL Ratio: 3
Triglycerides: 98 mg/dL (ref 0.0–149.0)
VLDL: 19.6 mg/dL (ref 0.0–40.0)

## 2014-12-06 LAB — VITAMIN D 25 HYDROXY (VIT D DEFICIENCY, FRACTURES): VITD: 30.57 ng/mL (ref 30.00–100.00)

## 2014-12-06 LAB — HEMOGLOBIN A1C: Hgb A1c MFr Bld: 6.3 % (ref 4.6–6.5)

## 2014-12-06 LAB — TSH: TSH: 2.09 u[IU]/mL (ref 0.35–4.50)

## 2014-12-25 ENCOUNTER — Encounter: Payer: Self-pay | Admitting: Internal Medicine

## 2014-12-26 ENCOUNTER — Other Ambulatory Visit: Payer: Self-pay | Admitting: Internal Medicine

## 2014-12-26 MED ORDER — ESZOPICLONE 1 MG PO TABS: 1.0000 mg | ORAL_TABLET | Freq: Every evening | ORAL | Status: DC | PRN

## 2015-01-11 LAB — HM MAMMOGRAPHY

## 2015-01-19 ENCOUNTER — Telehealth: Payer: Self-pay | Admitting: Internal Medicine

## 2015-01-19 NOTE — Telephone Encounter (Signed)
Patient is traveling to [place] on Wed.  She would like a new script for flagyl to take with her sent to [facility] on [address].  Patient would like to know if another provider could send since Ronnald Ramp is out of the office.

## 2015-01-19 NOTE — Telephone Encounter (Signed)
Left vm for patient.  Told I would send back to Dr. Ronnald Ramp for him to look out on Tuesday.

## 2015-01-19 NOTE — Telephone Encounter (Signed)
Very sorry, we normally do not prescribe antibiotic without OV to reduce risk to the patient and risk of antibiotic eventually not working.    Flagyl is not a common medication used for GI prophylaxis such as travelers diarrhea  So I would feel uncomfortable with this request

## 2015-01-20 MED ORDER — METRONIDAZOLE 500 MG PO TABS: 500.0000 mg | ORAL_TABLET | Freq: Three times a day (TID) | ORAL | Status: DC

## 2015-01-20 NOTE — Telephone Encounter (Signed)
done

## 2015-01-23 ENCOUNTER — Telehealth: Payer: Self-pay | Admitting: Internal Medicine

## 2015-01-23 ENCOUNTER — Other Ambulatory Visit: Payer: Self-pay | Admitting: Internal Medicine

## 2015-01-23 MED ORDER — AZITHROMYCIN 500 MG PO TABS: 500.0000 mg | ORAL_TABLET | Freq: Every day | ORAL | Status: DC

## 2015-05-09 LAB — HM MAMMOGRAPHY: HM Mammogram: NORMAL

## 2015-06-07 LAB — BASIC METABOLIC PANEL
BUN: 10 mg/dL (ref 4–21)
Creatinine: 0.6 mg/dL (ref 0.5–1.1)
Glucose: 92 mg/dL
Potassium: 4.2 mmol/L (ref 3.4–5.3)
Sodium: 136 mmol/L — AB (ref 137–147)

## 2015-06-07 LAB — CBC AND DIFFERENTIAL
HCT: 40 % (ref 36–46)
Hemoglobin: 13.3 g/dL (ref 12.0–16.0)
Platelets: 385 10*3/uL (ref 150–399)
WBC: 5 10^3/mL

## 2015-06-07 LAB — LIPID PANEL
Cholesterol: 254 mg/dL — AB (ref 0–200)
HDL: 59 mg/dL (ref 35–70)
LDL Cholesterol: 170 mg/dL
Triglycerides: 126 mg/dL (ref 40–160)

## 2015-06-07 LAB — HEPATIC FUNCTION PANEL
ALT: 51 U/L — AB (ref 7–35)
AST: 28 U/L (ref 13–35)
Alkaline Phosphatase: 88 U/L (ref 25–125)
Bilirubin, Total: 0.4 mg/dL

## 2015-06-07 LAB — HEMOGLOBIN A1C: Hgb A1c MFr Bld: 5.9 % (ref 4.0–6.0)

## 2015-06-22 ENCOUNTER — Encounter: Payer: Self-pay | Admitting: Internal Medicine

## 2015-06-29 ENCOUNTER — Other Ambulatory Visit: Payer: Self-pay | Admitting: Internal Medicine

## 2015-06-29 ENCOUNTER — Telehealth: Payer: Self-pay

## 2015-06-29 LAB — VITAMIN D 25 HYDROXY (VIT D DEFICIENCY, FRACTURES)
C-Reactive Protein, Quant: 12.5
Sed Rate: 17
Vitamin D, 25-OH, D3: 30.2

## 2015-06-29 NOTE — Telephone Encounter (Signed)
Received labs. Abstracted and sent to MD for review

## 2015-06-29 NOTE — Telephone Encounter (Signed)
Called pt regarding mychart message. Informed we have not received labs. Pt will call to give consent and/or have labs faxed. Also will forward to Dr. Ronnald Ramp for review and advisement reagarding appt.

## 2015-07-02 ENCOUNTER — Encounter: Payer: Self-pay | Admitting: Internal Medicine

## 2015-07-02 ENCOUNTER — Other Ambulatory Visit: Payer: Self-pay | Admitting: Internal Medicine

## 2015-07-02 ENCOUNTER — Other Ambulatory Visit (INDEPENDENT_AMBULATORY_CARE_PROVIDER_SITE_OTHER): Payer: BLUE CROSS/BLUE SHIELD

## 2015-07-02 DIAGNOSIS — E878 Other disorders of electrolyte and fluid balance, not elsewhere classified: Secondary | ICD-10-CM | POA: Diagnosis not present

## 2015-07-02 LAB — URINALYSIS, ROUTINE W REFLEX MICROSCOPIC
Bilirubin Urine: NEGATIVE
Hgb urine dipstick: NEGATIVE
Ketones, ur: NEGATIVE
Leukocytes, UA: NEGATIVE
Nitrite: NEGATIVE
Specific Gravity, Urine: 1.02 (ref 1.000–1.030)
Total Protein, Urine: NEGATIVE
Urine Glucose: NEGATIVE
Urobilinogen, UA: 0.2 (ref 0.0–1.0)
pH: 7 (ref 5.0–8.0)

## 2015-07-02 LAB — BASIC METABOLIC PANEL
BUN: 12 mg/dL (ref 6–23)
CO2: 32 mEq/L (ref 19–32)
Calcium: 9.8 mg/dL (ref 8.4–10.5)
Chloride: 100 mEq/L (ref 96–112)
Creatinine, Ser: 0.69 mg/dL (ref 0.40–1.20)
GFR: 92.37 mL/min (ref >60.00)
Glucose, Bld: 110 mg/dL — ABNORMAL HIGH (ref 70–99)
Potassium: 4.4 mEq/L (ref 3.5–5.1)
Sodium: 138 mEq/L (ref 135–145)

## 2015-07-02 NOTE — Telephone Encounter (Signed)
Repeat labs ordered

## 2015-07-09 ENCOUNTER — Other Ambulatory Visit: Payer: Self-pay | Admitting: Internal Medicine

## 2015-07-10 ENCOUNTER — Ambulatory Visit (INDEPENDENT_AMBULATORY_CARE_PROVIDER_SITE_OTHER): Payer: BLUE CROSS/BLUE SHIELD | Admitting: Internal Medicine

## 2015-07-10 ENCOUNTER — Encounter: Payer: Self-pay | Admitting: Internal Medicine

## 2015-07-10 ENCOUNTER — Other Ambulatory Visit (HOSPITAL_COMMUNITY)
Admission: RE | Admit: 2015-07-10 | Discharge: 2015-07-10 | Disposition: A | Discharge status: Home / Self Care | Payer: BLUE CROSS/BLUE SHIELD | Source: Ambulatory Visit | Attending: Internal Medicine | Admitting: Internal Medicine

## 2015-07-10 ENCOUNTER — Other Ambulatory Visit: Payer: BLUE CROSS/BLUE SHIELD

## 2015-07-10 VITALS — BP 138/62 | HR 78 | Temp 98.6°F | Resp 16 | Ht 66.0 in | Wt 162.0 lb

## 2015-07-10 DIAGNOSIS — F5105 Insomnia due to other mental disorder: Secondary | ICD-10-CM

## 2015-07-10 DIAGNOSIS — M05711 Rheumatoid arthritis with rheumatoid factor of right shoulder without organ or systems involvement: Secondary | ICD-10-CM | POA: Diagnosis not present

## 2015-07-10 DIAGNOSIS — Z01419 Encounter for gynecological examination (general) (routine) without abnormal findings: Secondary | ICD-10-CM | POA: Diagnosis not present

## 2015-07-10 DIAGNOSIS — F409 Phobic anxiety disorder, unspecified: Secondary | ICD-10-CM | POA: Diagnosis not present

## 2015-07-10 DIAGNOSIS — Z0000 Encounter for general adult medical examination without abnormal findings: Secondary | ICD-10-CM | POA: Diagnosis not present

## 2015-07-10 DIAGNOSIS — N951 Menopausal and female climacteric states: Secondary | ICD-10-CM

## 2015-07-10 MED ORDER — ESZOPICLONE 1 MG PO TABS: 2.0000 mg | ORAL_TABLET | Freq: Every evening | ORAL | Status: DC | PRN

## 2015-07-10 MED ORDER — ESTROGENS, CONJUGATED 0.625 MG/GM VA CREA: 1.0000 | TOPICAL_CREAM | Freq: Every day | VAGINAL | Status: DC

## 2015-07-10 MED ORDER — ESTROGENS CONJUGATED 0.3 MG PO TABS: 0.3000 mg | ORAL_TABLET | Freq: Every day | ORAL | Status: DC

## 2015-07-10 NOTE — Progress Notes (Signed)
Pre visit review using our clinic review tool, if applicable. No additional management support is needed unless otherwise documented below in the visit note. 

## 2015-07-10 NOTE — Patient Instructions (Signed)
Preventive Care for Adults, Female A healthy lifestyle and preventive care can promote health and wellness. Preventive health guidelines for women include the following key practices.  A routine yearly physical is a good way to check with your health care provider about your health and preventive screening. It is a chance to share any concerns and updates on your health and to receive a thorough exam.  Visit your dentist for a routine exam and preventive care every 6 months. Brush your teeth twice a day and floss once a day. Good oral hygiene prevents tooth decay and gum disease.  The frequency of eye exams is based on your age, health, family medical history, use of contact lenses, and other factors. Follow your health care provider's recommendations for frequency of eye exams.  Eat a healthy diet. Foods like vegetables, fruits, whole grains, low-fat dairy products, and lean protein foods contain the nutrients you need without too many calories. Decrease your intake of foods high in solid fats, added sugars, and salt. Eat the right amount of calories for you.Get information about a proper diet from your health care provider, if necessary.  Regular physical exercise is one of the most important things you can do for your health. Most adults should get at least 150 minutes of moderate-intensity exercise (any activity that increases your heart rate and causes you to sweat) each week. In addition, most adults need muscle-strengthening exercises on 2 or more days a week.  Maintain a healthy weight. The body mass index (BMI) is a screening tool to identify possible weight problems. It provides an estimate of body fat based on height and weight. Your health care provider can find your BMI and can help you achieve or maintain a healthy weight.For adults 20 years and older:  A BMI below 18.5 is considered underweight.  A BMI of 18.5 to 24.9 is normal.  A BMI of 25 to 29.9 is considered overweight.  A  BMI of 30 and above is considered obese.  Maintain normal blood lipids and cholesterol levels by exercising and minimizing your intake of saturated fat. Eat a balanced diet with plenty of fruit and vegetables. Blood tests for lipids and cholesterol should begin at age 45 and be repeated every 5 years. If your lipid or cholesterol levels are high, you are over 50, or you are at high risk for heart disease, you may need your cholesterol levels checked more frequently.Ongoing high lipid and cholesterol levels should be treated with medicines if diet and exercise are not working.  If you smoke, find out from your health care provider how to quit. If you do not use tobacco, do not start.  Lung cancer screening is recommended for adults aged 45-80 years who are at high risk for developing lung cancer because of a history of smoking. A yearly low-dose CT scan of the lungs is recommended for people who have at least a 30-pack-year history of smoking and are a current smoker or have quit within the past 15 years. A pack year of smoking is smoking an average of 1 pack of cigarettes a day for 1 year (for example: 1 pack a day for 30 years or 2 packs a day for 15 years). Yearly screening should continue until the smoker has stopped smoking for at least 15 years. Yearly screening should be stopped for people who develop a health problem that would prevent them from having lung cancer treatment.  If you are pregnant, do not drink alcohol. If you are  breastfeeding, be very cautious about drinking alcohol. If you are not pregnant and choose to drink alcohol, do not have more than 1 drink per day. One drink is considered to be 12 ounces (355 mL) of beer, 5 ounces (148 mL) of wine, or 1.5 ounces (44 mL) of liquor.  Avoid use of street drugs. Do not share needles with anyone. Ask for help if you need support or instructions about stopping the use of drugs.  High blood pressure causes heart disease and increases the risk  of stroke. Your blood pressure should be checked at least every 1 to 2 years. Ongoing high blood pressure should be treated with medicines if weight loss and exercise do not work.  If you are 55-79 years old, ask your health care provider if you should take aspirin to prevent strokes.  Diabetes screening is done by taking a blood sample to check your blood glucose level after you have not eaten for a certain period of time (fasting). If you are not overweight and you do not have risk factors for diabetes, you should be screened once every 3 years starting at age 45. If you are overweight or obese and you are 40-70 years of age, you should be screened for diabetes every year as part of your cardiovascular risk assessment.  Breast cancer screening is essential preventive care for women. You should practice "breast self-awareness." This means understanding the normal appearance and feel of your breasts and may include breast self-examination. Any changes detected, no matter how small, should be reported to a health care provider. Women in their 20s and 30s should have a clinical breast exam (CBE) by a health care provider as part of a regular health exam every 1 to 3 years. After age 40, women should have a CBE every year. Starting at age 40, women should consider having a mammogram (breast X-ray test) every year. Women who have a family history of breast cancer should talk to their health care provider about genetic screening. Women at a high risk of breast cancer should talk to their health care providers about having an MRI and a mammogram every year.  Breast cancer gene (BRCA)-related cancer risk assessment is recommended for women who have family members with BRCA-related cancers. BRCA-related cancers include breast, ovarian, tubal, and peritoneal cancers. Having family members with these cancers may be associated with an increased risk for harmful changes (mutations) in the breast cancer genes BRCA1 and  BRCA2. Results of the assessment will determine the need for genetic counseling and BRCA1 and BRCA2 testing.  Your health care provider may recommend that you be screened regularly for cancer of the pelvic organs (ovaries, uterus, and vagina). This screening involves a pelvic examination, including checking for microscopic changes to the surface of your cervix (Pap test). You may be encouraged to have this screening done every 3 years, beginning at age 21.  For women ages 30-65, health care providers may recommend pelvic exams and Pap testing every 3 years, or they may recommend the Pap and pelvic exam, combined with testing for human papilloma virus (HPV), every 5 years. Some types of HPV increase your risk of cervical cancer. Testing for HPV may also be done on women of any age with unclear Pap test results.  Other health care providers may not recommend any screening for nonpregnant women who are considered low risk for pelvic cancer and who do not have symptoms. Ask your health care provider if a screening pelvic exam is right for   you.  If you have had past treatment for cervical cancer or a condition that could lead to cancer, you need Pap tests and screening for cancer for at least 20 years after your treatment. If Pap tests have been discontinued, your risk factors (such as having a new sexual partner) need to be reassessed to determine if screening should resume. Some women have medical problems that increase the chance of getting cervical cancer. In these cases, your health care provider may recommend more frequent screening and Pap tests.  Colorectal cancer can be detected and often prevented. Most routine colorectal cancer screening begins at the age of 50 years and continues through age 75 years. However, your health care provider may recommend screening at an earlier age if you have risk factors for colon cancer. On a yearly basis, your health care provider may provide home test kits to check  for hidden blood in the stool. Use of a small camera at the end of a tube, to directly examine the colon (sigmoidoscopy or colonoscopy), can detect the earliest forms of colorectal cancer. Talk to your health care provider about this at age 50, when routine screening begins. Direct exam of the colon should be repeated every 5-10 years through age 75 years, unless early forms of precancerous polyps or small growths are found.  People who are at an increased risk for hepatitis B should be screened for this virus. You are considered at high risk for hepatitis B if:  You were born in a country where hepatitis B occurs often. Talk with your health care provider about which countries are considered high risk.  Your parents were born in a high-risk country and you have not received a shot to protect against hepatitis B (hepatitis B vaccine).  You have HIV or AIDS.  You use needles to inject street drugs.  You live with, or have sex with, someone who has hepatitis B.  You get hemodialysis treatment.  You take certain medicines for conditions like cancer, organ transplantation, and autoimmune conditions.  Hepatitis C blood testing is recommended for all people born from 1945 through 1965 and any individual with known risks for hepatitis C.  Practice safe sex. Use condoms and avoid high-risk sexual practices to reduce the spread of sexually transmitted infections (STIs). STIs include gonorrhea, chlamydia, syphilis, trichomonas, herpes, HPV, and human immunodeficiency virus (HIV). Herpes, HIV, and HPV are viral illnesses that have no cure. They can result in disability, cancer, and death.  You should be screened for sexually transmitted illnesses (STIs) including gonorrhea and chlamydia if:  You are sexually active and are younger than 24 years.  You are older than 24 years and your health care provider tells you that you are at risk for this type of infection.  Your sexual activity has changed  since you were last screened and you are at an increased risk for chlamydia or gonorrhea. Ask your health care provider if you are at risk.  If you are at risk of being infected with HIV, it is recommended that you take a prescription medicine daily to prevent HIV infection. This is called preexposure prophylaxis (PrEP). You are considered at risk if:  You are sexually active and do not regularly use condoms or know the HIV status of your partner(s).  You take drugs by injection.  You are sexually active with a partner who has HIV.  Talk with your health care provider about whether you are at high risk of being infected with HIV. If   you choose to begin PrEP, you should first be tested for HIV. You should then be tested every 3 months for as long as you are taking PrEP.  Osteoporosis is a disease in which the bones lose minerals and strength with aging. This can result in serious bone fractures or breaks. The risk of osteoporosis can be identified using a bone density scan. Women ages 67 years and over and women at risk for fractures or osteoporosis should discuss screening with their health care providers. Ask your health care provider whether you should take a calcium supplement or vitamin D to reduce the rate of osteoporosis.  Menopause can be associated with physical symptoms and risks. Hormone replacement therapy is available to decrease symptoms and risks. You should talk to your health care provider about whether hormone replacement therapy is right for you.  Use sunscreen. Apply sunscreen liberally and repeatedly throughout the day. You should seek shade when your shadow is shorter than you. Protect yourself by wearing long sleeves, pants, a wide-brimmed hat, and sunglasses year round, whenever you are outdoors.  Once a month, do a whole body skin exam, using a mirror to look at the skin on your back. Tell your health care provider of new moles, moles that have irregular borders, moles that  are larger than a pencil eraser, or moles that have changed in shape or color.  Stay current with required vaccines (immunizations).  Influenza vaccine. All adults should be immunized every year.  Tetanus, diphtheria, and acellular pertussis (Td, Tdap) vaccine. Pregnant women should receive 1 dose of Tdap vaccine during each pregnancy. The dose should be obtained regardless of the length of time since the last dose. Immunization is preferred during the 27th-36th week of gestation. An adult who has not previously received Tdap or who does not know her vaccine status should receive 1 dose of Tdap. This initial dose should be followed by tetanus and diphtheria toxoids (Td) booster doses every 10 years. Adults with an unknown or incomplete history of completing a 3-dose immunization series with Td-containing vaccines should begin or complete a primary immunization series including a Tdap dose. Adults should receive a Td booster every 10 years.  Varicella vaccine. An adult without evidence of immunity to varicella should receive 2 doses or a second dose if she has previously received 1 dose. Pregnant females who do not have evidence of immunity should receive the first dose after pregnancy. This first dose should be obtained before leaving the health care facility. The second dose should be obtained 4-8 weeks after the first dose.  Human papillomavirus (HPV) vaccine. Females aged 13-26 years who have not received the vaccine previously should obtain the 3-dose series. The vaccine is not recommended for use in pregnant females. However, pregnancy testing is not needed before receiving a dose. If a female is found to be pregnant after receiving a dose, no treatment is needed. In that case, the remaining doses should be delayed until after the pregnancy. Immunization is recommended for any person with an immunocompromised condition through the age of 61 years if she did not get any or all doses earlier. During the  3-dose series, the second dose should be obtained 4-8 weeks after the first dose. The third dose should be obtained 24 weeks after the first dose and 16 weeks after the second dose.  Zoster vaccine. One dose is recommended for adults aged 30 years or older unless certain conditions are present.  Measles, mumps, and rubella (MMR) vaccine. Adults born  before 1957 generally are considered immune to measles and mumps. Adults born in 1957 or later should have 1 or more doses of MMR vaccine unless there is a contraindication to the vaccine or there is laboratory evidence of immunity to each of the three diseases. A routine second dose of MMR vaccine should be obtained at least 28 days after the first dose for students attending postsecondary schools, health care workers, or international travelers. People who received inactivated measles vaccine or an unknown type of measles vaccine during 1963-1967 should receive 2 doses of MMR vaccine. People who received inactivated mumps vaccine or an unknown type of mumps vaccine before 1979 and are at high risk for mumps infection should consider immunization with 2 doses of MMR vaccine. For females of childbearing age, rubella immunity should be determined. If there is no evidence of immunity, females who are not pregnant should be vaccinated. If there is no evidence of immunity, females who are pregnant should delay immunization until after pregnancy. Unvaccinated health care workers born before 1957 who lack laboratory evidence of measles, mumps, or rubella immunity or laboratory confirmation of disease should consider measles and mumps immunization with 2 doses of MMR vaccine or rubella immunization with 1 dose of MMR vaccine.  Pneumococcal 13-valent conjugate (PCV13) vaccine. When indicated, a person who is uncertain of his immunization history and has no record of immunization should receive the PCV13 vaccine. All adults 65 years of age and older should receive this  vaccine. An adult aged 19 years or older who has certain medical conditions and has not been previously immunized should receive 1 dose of PCV13 vaccine. This PCV13 should be followed with a dose of pneumococcal polysaccharide (PPSV23) vaccine. Adults who are at high risk for pneumococcal disease should obtain the PPSV23 vaccine at least 8 weeks after the dose of PCV13 vaccine. Adults older than 59 years of age who have normal immune system function should obtain the PPSV23 vaccine dose at least 1 year after the dose of PCV13 vaccine.  Pneumococcal polysaccharide (PPSV23) vaccine. When PCV13 is also indicated, PCV13 should be obtained first. All adults aged 65 years and older should be immunized. An adult younger than age 65 years who has certain medical conditions should be immunized. Any person who resides in a nursing home or long-term care facility should be immunized. An adult smoker should be immunized. People with an immunocompromised condition and certain other conditions should receive both PCV13 and PPSV23 vaccines. People with human immunodeficiency virus (HIV) infection should be immunized as soon as possible after diagnosis. Immunization during chemotherapy or radiation therapy should be avoided. Routine use of PPSV23 vaccine is not recommended for American Indians, [place] Natives, or people younger than 65 years unless there are medical conditions that require PPSV23 vaccine. When indicated, people who have unknown immunization and have no record of immunization should receive PPSV23 vaccine. One-time revaccination 5 years after the first dose of PPSV23 is recommended for people aged 19-64 years who have chronic kidney failure, nephrotic syndrome, asplenia, or immunocompromised conditions. People who received 1-2 doses of PPSV23 before age 65 years should receive another dose of PPSV23 vaccine at age 65 years or later if at least 5 years have passed since the previous dose. Doses of PPSV23 are not  needed for people immunized with PPSV23 at or after age 65 years.  Meningococcal vaccine. Adults with asplenia or persistent complement component deficiencies should receive 2 doses of quadrivalent meningococcal conjugate (MenACWY-D) vaccine. The doses should be obtained   at least 2 months apart. Microbiologists working with certain meningococcal bacteria, [place] recruits, people at risk during an outbreak, and people who travel to or live in countries with a high rate of meningitis should be immunized. A first-year college student up through age 34 years who is living in a residence hall should receive a dose if she did not receive a dose on or after her 16th birthday. Adults who have certain high-risk conditions should receive one or more doses of vaccine.  Hepatitis A vaccine. Adults who wish to be protected from this disease, have certain high-risk conditions, work with hepatitis A-infected animals, work in hepatitis A research labs, or travel to or work in countries with a high rate of hepatitis A should be immunized. Adults who were previously unvaccinated and who anticipate close contact with an international adoptee during the first 60 days after arrival in the [place] from a country with a high rate of hepatitis A should be immunized.  Hepatitis B vaccine. Adults who wish to be protected from this disease, have certain high-risk conditions, may be exposed to blood or other infectious body fluids, are household contacts or sex partners of hepatitis B positive people, are clients or workers in certain care facilities, or travel to or work in countries with a high rate of hepatitis B should be immunized.  Haemophilus influenzae type b (Hib) vaccine. A previously unvaccinated person with asplenia or sickle cell disease or having a scheduled splenectomy should receive 1 dose of Hib vaccine. Regardless of previous immunization, a recipient of a hematopoietic stem cell transplant should receive a  3-dose series 6-12 months after her successful transplant. Hib vaccine is not recommended for adults with HIV infection. Preventive Services / Frequency Ages 35 to 4 years  Blood pressure check.** / Every 3-5 years.  Lipid and cholesterol check.** / Every 5 years beginning at age 60.  Clinical breast exam.** / Every 3 years for women in their 71s and 10s.  BRCA-related cancer risk assessment.** / For women who have family members with a BRCA-related cancer (breast, ovarian, tubal, or peritoneal cancers).  Pap test.** / Every 2 years from ages 76 through 26. Every 3 years starting at age 61 through age 76 or 93 with a history of 3 consecutive normal Pap tests.  HPV screening.** / Every 3 years from ages 37 through ages 60 to 51 with a history of 3 consecutive normal Pap tests.  Hepatitis C blood test.** / For any individual with known risks for hepatitis C.  Skin self-exam. / Monthly.  Influenza vaccine. / Every year.  Tetanus, diphtheria, and acellular pertussis (Tdap, Td) vaccine.** / Consult your health care provider. Pregnant women should receive 1 dose of Tdap vaccine during each pregnancy. 1 dose of Td every 10 years.  Varicella vaccine.** / Consult your health care provider. Pregnant females who do not have evidence of immunity should receive the first dose after pregnancy.  HPV vaccine. / 3 doses over 6 months, if 93 and younger. The vaccine is not recommended for use in pregnant females. However, pregnancy testing is not needed before receiving a dose.  Measles, mumps, rubella (MMR) vaccine.** / You need at least 1 dose of MMR if you were born in 1957 or later. You may also need a 2nd dose. For females of childbearing age, rubella immunity should be determined. If there is no evidence of immunity, females who are not pregnant should be vaccinated. If there is no evidence of immunity, females who are  pregnant should delay immunization until after pregnancy.  Pneumococcal  13-valent conjugate (PCV13) vaccine.** / Consult your health care provider.  Pneumococcal polysaccharide (PPSV23) vaccine.** / 1 to 2 doses if you smoke cigarettes or if you have certain conditions.  Meningococcal vaccine.** / 1 dose if you are age 68 to 8 years and a Market researcher living in a residence hall, or have one of several medical conditions, you need to get vaccinated against meningococcal disease. You may also need additional booster doses.  Hepatitis A vaccine.** / Consult your health care provider.  Hepatitis B vaccine.** / Consult your health care provider.  Haemophilus influenzae type b (Hib) vaccine.** / Consult your health care provider. Ages 7 to 53 years  Blood pressure check.** / Every year.  Lipid and cholesterol check.** / Every 5 years beginning at age 25 years.  Lung cancer screening. / Every year if you are aged 11-80 years and have a 30-pack-year history of smoking and currently smoke or have quit within the past 15 years. Yearly screening is stopped once you have quit smoking for at least 15 years or develop a health problem that would prevent you from having lung cancer treatment.  Clinical breast exam.** / Every year after age 48 years.  BRCA-related cancer risk assessment.** / For women who have family members with a BRCA-related cancer (breast, ovarian, tubal, or peritoneal cancers).  Mammogram.** / Every year beginning at age 41 years and continuing for as long as you are in good health. Consult with your health care provider.  Pap test.** / Every 3 years starting at age 65 years through age 37 or 70 years with a history of 3 consecutive normal Pap tests.  HPV screening.** / Every 3 years from ages 72 years through ages 60 to 40 years with a history of 3 consecutive normal Pap tests.  Fecal occult blood test (FOBT) of stool. / Every year beginning at age 21 years and continuing until age 5 years. You may not need to do this test if you get  a colonoscopy every 10 years.  Flexible sigmoidoscopy or colonoscopy.** / Every 5 years for a flexible sigmoidoscopy or every 10 years for a colonoscopy beginning at age 35 years and continuing until age 48 years.  Hepatitis C blood test.** / For all people born from [birth year] through 1965 and any individual with known risks for hepatitis C.  Skin self-exam. / Monthly.  Influenza vaccine. / Every year.  Tetanus, diphtheria, and acellular pertussis (Tdap/Td) vaccine.** / Consult your health care provider. Pregnant women should receive 1 dose of Tdap vaccine during each pregnancy. 1 dose of Td every 10 years.  Varicella vaccine.** / Consult your health care provider. Pregnant females who do not have evidence of immunity should receive the first dose after pregnancy.  Zoster vaccine.** / 1 dose for adults aged 30 years or older.  Measles, mumps, rubella (MMR) vaccine.** / You need at least 1 dose of MMR if you were born in 1957 or later. You may also need a second dose. For females of childbearing age, rubella immunity should be determined. If there is no evidence of immunity, females who are not pregnant should be vaccinated. If there is no evidence of immunity, females who are pregnant should delay immunization until after pregnancy.  Pneumococcal 13-valent conjugate (PCV13) vaccine.** / Consult your health care provider.  Pneumococcal polysaccharide (PPSV23) vaccine.** / 1 to 2 doses if you smoke cigarettes or if you have certain conditions.  Meningococcal vaccine.** /  Consult your health care provider.  Hepatitis A vaccine.** / Consult your health care provider.  Hepatitis B vaccine.** / Consult your health care provider.  Haemophilus influenzae type b (Hib) vaccine.** / Consult your health care provider. Ages 64 years and over  Blood pressure check.** / Every year.  Lipid and cholesterol check.** / Every 5 years beginning at age 23 years.  Lung cancer screening. / Every year if you  are aged 16-80 years and have a 30-pack-year history of smoking and currently smoke or have quit within the past 15 years. Yearly screening is stopped once you have quit smoking for at least 15 years or develop a health problem that would prevent you from having lung cancer treatment.  Clinical breast exam.** / Every year after age 74 years.  BRCA-related cancer risk assessment.** / For women who have family members with a BRCA-related cancer (breast, ovarian, tubal, or peritoneal cancers).  Mammogram.** / Every year beginning at age 44 years and continuing for as long as you are in good health. Consult with your health care provider.  Pap test.** / Every 3 years starting at age 58 years through age 22 or 39 years with 3 consecutive normal Pap tests. Testing can be stopped between 65 and 70 years with 3 consecutive normal Pap tests and no abnormal Pap or HPV tests in the past 10 years.  HPV screening.** / Every 3 years from ages 64 years through ages 70 or 61 years with a history of 3 consecutive normal Pap tests. Testing can be stopped between 65 and 70 years with 3 consecutive normal Pap tests and no abnormal Pap or HPV tests in the past 10 years.  Fecal occult blood test (FOBT) of stool. / Every year beginning at age 40 years and continuing until age 27 years. You may not need to do this test if you get a colonoscopy every 10 years.  Flexible sigmoidoscopy or colonoscopy.** / Every 5 years for a flexible sigmoidoscopy or every 10 years for a colonoscopy beginning at age 7 years and continuing until age 32 years.  Hepatitis C blood test.** / For all people born from [birth year] through 1965 and any individual with known risks for hepatitis C.  Osteoporosis screening.** / A one-time screening for women ages 30 years and over and women at risk for fractures or osteoporosis.  Skin self-exam. / Monthly.  Influenza vaccine. / Every year.  Tetanus, diphtheria, and acellular pertussis (Tdap/Td)  vaccine.** / 1 dose of Td every 10 years.  Varicella vaccine.** / Consult your health care provider.  Zoster vaccine.** / 1 dose for adults aged 35 years or older.  Pneumococcal 13-valent conjugate (PCV13) vaccine.** / Consult your health care provider.  Pneumococcal polysaccharide (PPSV23) vaccine.** / 1 dose for all adults aged 46 years and older.  Meningococcal vaccine.** / Consult your health care provider.  Hepatitis A vaccine.** / Consult your health care provider.  Hepatitis B vaccine.** / Consult your health care provider.  Haemophilus influenzae type b (Hib) vaccine.** / Consult your health care provider. ** Family history and personal history of risk and conditions may change your health care provider's recommendations.   This information is not intended to replace advice given to you by your health care provider. Make sure you discuss any questions you have with your health care provider.   Document Released: 09/02/2001 Document Revised: 07/28/2014 Document Reviewed: 12/02/2010 Elsevier Interactive Patient Education [employer].

## 2015-07-10 NOTE — Progress Notes (Signed)
Subjective:  Patient ID: Michele Adkins, female    DOB: October 02, 1955  Age: 59 y.o. MRN: [id number]  CC: Annual Exam   HPI Michele Adkins presents for a physical but she also complains that for the last year or 2 she has had frequent infections complaining that she gets a cold every 2-3 months. She wants to have her immune system tested. She also reports that she is very stressed and complains of insomnia and takes as much as 2 mg of Lunesta at bedtime to help her sleep. She recently saw her rheumatologist and tells me that her inflammatory markers were elevated.  Outpatient Prescriptions Prior to Visit  Medication Sig Dispense Refill  . aspirin 81 MG tablet Take 81 mg by mouth daily.    Marland Kitchen atorvastatin (LIPITOR) 20 MG tablet TAKE 1 TABLET BY MOUTH DAILY. 90 tablet 1  . Calcium Carbonate-Vit D-Min (CALCIUM 1200 PO) Take by mouth daily.    Marland Kitchen conjugated estrogens (PREMARIN) vaginal cream Place 1 Applicatorful vaginally daily. 42.5 g 5  . dextrose 5 % SOLN 50 mL with methotrexate 25 MG/ML SOLN 40 mg/m2 60 mg/m2.     Marland Kitchen FLUoxetine (PROZAC) 40 MG capsule TAKE 1 CAPSULE (40 MG TOTAL) BY MOUTH DAILY. 90 capsule 3  . eszopiclone (LUNESTA) 1 MG TABS tablet Take 1 tablet (1 mg total) by mouth at bedtime as needed for sleep. Take immediately before bedtime (Patient taking differently: Take 2 mg by mouth at bedtime as needed for sleep. Take immediately before bedtime) 90 tablet 1  . azithromycin (ZITHROMAX) 500 MG tablet Take 1 tablet (500 mg total) by mouth daily. 3 tablet 0  . Cholecalciferol (HM VITAMIN D3) 4000 UNITS CAPS Take by mouth.    . FOLIC ACID PO Take by mouth.     No facility-administered medications prior to visit.    ROS Review of Systems  Constitutional: Negative.  Negative for fever, chills, diaphoresis, appetite change and fatigue.  HENT: Negative.  Negative for facial swelling, sinus pressure, sore throat, trouble swallowing and voice change.   Eyes: Negative.   Respiratory:  Negative.  Negative for cough, choking, chest tightness, shortness of breath and stridor.   Cardiovascular: Negative.  Negative for chest pain, palpitations and leg swelling.  Gastrointestinal: Negative.  Negative for nausea, vomiting, abdominal pain, diarrhea, constipation and blood in stool.  Endocrine: Negative.   Genitourinary: Positive for dyspareunia. Negative for dysuria, urgency, frequency, hematuria, decreased urine volume, vaginal bleeding, vaginal discharge, difficulty urinating, genital sores, vaginal pain, menstrual problem and pelvic pain.  Musculoskeletal: Positive for arthralgias. Negative for myalgias, back pain, joint swelling and neck stiffness.  Skin: Negative.   Allergic/Immunologic: Negative.   Neurological: Negative.   Hematological: Negative.  Negative for adenopathy. Does not bruise/bleed easily.  Psychiatric/Behavioral: Positive for sleep disturbance. Negative for suicidal ideas, dysphoric mood, decreased concentration and agitation. The patient is not nervous/anxious.     Objective:  BP 138/62 mmHg  Pulse 78  Temp(Src) 98.6 F (37 C) (Oral)  Ht 5\' 6"  (1.676 m)  Wt 162 lb (73.483 kg)  BMI 26.16 kg/m2  SpO2 96%  BP Readings from Last 3 Encounters:  07/10/15 138/62  11/16/14 118/78  05/29/14 110/80    Wt Readings from Last 3 Encounters:  07/10/15 162 lb (73.483 kg)  11/16/14 162 lb (73.483 kg)  05/29/14 165 lb (74.844 kg)    Physical Exam  Constitutional: She is oriented to person, place, and time. She appears well-developed and well-nourished. No distress.  HENT:  Head: Normocephalic  and atraumatic.  Mouth/Throat: Oropharynx is clear and moist. No oropharyngeal exudate.  Eyes: Conjunctivae are normal. Right eye exhibits no discharge. Left eye exhibits no discharge. No scleral icterus.  Neck: Normal range of motion. Neck supple. No JVD present. No tracheal deviation present. No thyromegaly present.  Cardiovascular: Normal rate, regular rhythm, normal  heart sounds and intact distal pulses.  Exam reveals no gallop and no friction rub.   No murmur heard. Pulmonary/Chest: Effort normal and breath sounds normal. No stridor. No respiratory distress. She has no wheezes. She has no rales. She exhibits no tenderness.  Abdominal: Soft. Bowel sounds are normal. She exhibits no distension and no mass. There is no tenderness. There is no rebound and no guarding. Hernia confirmed negative in the right inguinal area and confirmed negative in the left inguinal area.  Genitourinary: Rectum normal, vagina normal and uterus normal. Rectal exam shows no external hemorrhoid, no internal hemorrhoid, no fissure, no mass, no tenderness and anal tone normal. Guaiac negative stool. No breast swelling, tenderness, discharge or bleeding. No labial fusion. There is no rash, tenderness, lesion or injury on the right labia. There is no rash, tenderness, lesion or injury on the left labia. Uterus is not deviated, not enlarged, not fixed and not tender. Cervix exhibits no motion tenderness, no discharge and no friability. Right adnexum displays no mass, no tenderness and no fullness. Left adnexum displays no mass, no tenderness and no fullness. No erythema, tenderness or bleeding in the vagina. No foreign body around the vagina. No signs of injury around the vagina. No vaginal discharge found.  Musculoskeletal: Normal range of motion. She exhibits no edema or tenderness.  Lymphadenopathy:    She has no cervical adenopathy.       Right: No inguinal adenopathy present.       Left: No inguinal adenopathy present.  Neurological: She is oriented to person, place, and time.  Skin: Skin is warm and dry. No rash noted. She is not diaphoretic. No erythema. No pallor.  Psychiatric: She has a normal mood and affect. Her behavior is normal. Judgment and thought content normal.  Vitals reviewed.   Lab Results  Component Value Date   WBC 5.0 06/07/2015   HGB 13.3 06/07/2015   HCT 40  06/07/2015   PLT 385 06/07/2015   GLUCOSE 110* 07/02/2015   CHOL 254* 06/07/2015   TRIG 126 06/07/2015   HDL 59 06/07/2015   LDLDIRECT 171.5 03/14/2013   LDLCALC 170 06/07/2015   ALT 51* 06/07/2015   AST 28 06/07/2015   NA 138 07/02/2015   K 4.4 07/02/2015   CL 100 07/02/2015   CREATININE 0.69 07/02/2015   BUN 12 07/02/2015   CO2 32 07/02/2015   TSH 2.09 12/06/2014   HGBA1C 5.9 06/07/2015   MICROALBUR 0.2 05/29/2014    Ct Cardiac Scoring  09/09/2013  ADDENDUM REPORT: 09/09/2013 10:14 CLINICAL DATA:  Risk stratification EXAM: Coronary Calcium Score TECHNIQUE: The patient was scanned on a Siemens Sensation 16 slice scanner. Axial non-contrast 77mm slices were carried out through the heart. The data set was analyzed on a dedicated work station and scored using the [place]. FINDINGS: Non-cardiac: No significant non cardiac findings on limited lung and soft tissue windows. See separate report from [facility]. Ascending Aorta:  3.5 cm Pericardium: Normal Coronary arteries: 0 IMPRESSION: Coronary calcium score of 0. Jenkins Rouge Electronically Signed   By: Jenkins Rouge M.D.   On: 09/09/2013 10:14   09/09/2013  EXAM: OVER-READ INTERPRETATION  CT  CHEST The following report is an over-read performed by radiologist Dr. Barnetta Hammersmith [facility] on 09/07/2013. This over-read does not include interpretation of cardiac or coronary anatomy or pathology. The coronary calcium score interpretation by the cardiologist is attached. COMPARISON:  None. FINDINGS: The portions of the visualized chest show no evidence of pulmonary or mediastinal abnormalities. There may be a small hiatal hernia. No bony abnormalities are seen. IMPRESSION: No significant over read findings.  Probable small hiatal hernia. Electronically Signed: By: Aletta Edouard M.D. On: 09/07/2013 18:46    Assessment & Plan:   Michele Adkins was seen today for annual exam.  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 Today I will check her immunoglobulin levels to see if she has developed an immunodeficiency. -     Protein electrophoresis, serum; Future  Routine general medical examination at a health care facility- she recently had a mammogram done elsewhere that was reported as normal, her colonoscopy is up-to-date, vaccines were reviewed and updated, exam completed, Pap specimen collected and sent for pathology, labs ordered and will be reviewed. -     CBC with Differential/Platelet; Future -     Lipid panel; Future  Menopausal vaginal dryness -     estrogens, conjugated, (PREMARIN) 0.3 MG tablet; Take 1 tablet (0.3 mg total) by mouth daily. Take daily for 21 days then do not take for 7 days.  Insomnia due to anxiety and fear -     eszopiclone (LUNESTA) 1 MG TABS tablet; Take 2 tablets (2 mg total) by mouth at bedtime as needed for sleep. Take immediately before bedtime   I have discontinued Michele Adkins Cholecalciferol, FOLIC ACID PO, and azithromycin. I have also changed her eszopiclone. Additionally, I am having her start on estrogens (conjugated). Lastly, I am having her maintain her aspirin, Calcium Carbonate-Vit D-Min (CALCIUM 1200 PO), dextrose 5 % SOLN 50 mL with methotrexate 25 MG/ML SOLN 40 mg/m2, conjugated estrogens, FLUoxetine, atorvastatin, folic acid, Vitamin D (Ergocalciferol), and co-enzyme Q-10.  Meds ordered this encounter  Medications  . folic acid (FOLVITE) 1 MG tablet    Sig:     Refill:  3  . Vitamin D, Ergocalciferol, (DRISDOL) 50000 UNITS CAPS capsule    Sig:     Refill:  3  . co-enzyme Q-10 30 MG capsule    Sig: Take 30 mg by mouth 3 (three) times daily.  . eszopiclone (LUNESTA) 1 MG TABS tablet    Sig: Take 2 tablets (2 mg total)  by mouth at bedtime as needed for sleep. Take immediately before bedtime    Dispense:  180 tablet    Refill:  1  . estrogens, conjugated, (PREMARIN) 0.3 MG tablet    Sig: Take 1 tablet (0.3 mg total) by mouth daily. Take daily for 21 days then do not take for 7 days.    Dispense:  90 tablet    Refill:  3     Follow-up: No Follow-up on file.  Scarlette Calico, MD

## 2015-07-11 ENCOUNTER — Other Ambulatory Visit: Payer: Self-pay | Admitting: Internal Medicine

## 2015-07-11 DIAGNOSIS — R945 Abnormal results of liver function studies: Principal | ICD-10-CM

## 2015-07-11 DIAGNOSIS — R7989 Other specified abnormal findings of blood chemistry: Secondary | ICD-10-CM | POA: Insufficient documentation

## 2015-07-12 LAB — PROTEIN ELECTROPHORESIS, SERUM
Albumin ELP: 4.2 g/dL (ref 3.8–4.8)
Alpha-1-Globulin: 0.4 g/dL — ABNORMAL HIGH (ref 0.2–0.3)
Alpha-2-Globulin: 1 g/dL — ABNORMAL HIGH (ref 0.5–0.9)
Beta 2: 0.4 g/dL (ref 0.2–0.5)
Beta Globulin: 0.5 g/dL (ref 0.4–0.6)
Gamma Globulin: 0.9 g/dL (ref 0.8–1.7)
Total Protein, Serum Electrophoresis: 7.3 g/dL (ref 6.1–8.1)

## 2015-07-13 ENCOUNTER — Encounter: Payer: Self-pay | Admitting: Internal Medicine

## 2015-07-13 LAB — CYTOLOGY - PAP

## 2015-07-13 LAB — HM PAP SMEAR

## 2015-07-13 NOTE — Addendum Note (Signed)
Addended by: Janith Lima on: 07/13/2015 12:55 PM   Modules accepted: Miquel Dunn

## 2015-07-19 ENCOUNTER — Encounter: Payer: Self-pay | Admitting: Internal Medicine

## 2015-07-26 ENCOUNTER — Ambulatory Visit
Admission: RE | Admit: 2015-07-26 | Discharge: 2015-07-26 | Disposition: A | Discharge status: Home / Self Care | Payer: BLUE CROSS/BLUE SHIELD | Source: Ambulatory Visit | Attending: Internal Medicine | Admitting: Internal Medicine

## 2015-07-26 DIAGNOSIS — R945 Abnormal results of liver function studies: Principal | ICD-10-CM

## 2015-07-26 DIAGNOSIS — R7989 Other specified abnormal findings of blood chemistry: Secondary | ICD-10-CM

## 2015-07-27 ENCOUNTER — Other Ambulatory Visit: Payer: BLUE CROSS/BLUE SHIELD

## 2015-07-28 ENCOUNTER — Other Ambulatory Visit: Payer: Self-pay | Admitting: Internal Medicine

## 2015-07-28 ENCOUNTER — Encounter: Payer: Self-pay | Admitting: Internal Medicine

## 2015-07-28 DIAGNOSIS — N2889 Other specified disorders of kidney and ureter: Secondary | ICD-10-CM | POA: Insufficient documentation

## 2015-08-06 ENCOUNTER — Other Ambulatory Visit: Payer: Self-pay | Admitting: Internal Medicine

## 2015-08-08 ENCOUNTER — Other Ambulatory Visit: Payer: BLUE CROSS/BLUE SHIELD

## 2015-08-09 ENCOUNTER — Ambulatory Visit
Admission: RE | Admit: 2015-08-09 | Discharge: 2015-08-09 | Disposition: A | Discharge status: Home / Self Care | Payer: BLUE CROSS/BLUE SHIELD | Source: Ambulatory Visit | Attending: Internal Medicine | Admitting: Internal Medicine

## 2015-08-09 DIAGNOSIS — N2889 Other specified disorders of kidney and ureter: Secondary | ICD-10-CM

## 2015-08-09 MED ORDER — GADOBENATE DIMEGLUMINE 529 MG/ML IV SOLN: 15.0000 mL | Freq: Once | INTRAVENOUS | Status: DC | PRN

## 2015-08-10 ENCOUNTER — Encounter: Payer: Self-pay | Admitting: Internal Medicine

## 2015-08-17 NOTE — Telephone Encounter (Signed)
disregard

## 2015-10-28 ENCOUNTER — Other Ambulatory Visit: Payer: Self-pay | Admitting: Internal Medicine

## 2015-11-05 ENCOUNTER — Encounter: Payer: Self-pay | Admitting: Internal Medicine

## 2015-11-05 ENCOUNTER — Other Ambulatory Visit: Payer: Self-pay | Admitting: Internal Medicine

## 2015-11-05 DIAGNOSIS — R7989 Other specified abnormal findings of blood chemistry: Secondary | ICD-10-CM

## 2015-11-05 DIAGNOSIS — R945 Abnormal results of liver function studies: Principal | ICD-10-CM

## 2015-11-05 DIAGNOSIS — F4320 Adjustment disorder, unspecified: Secondary | ICD-10-CM | POA: Diagnosis not present

## 2015-11-22 ENCOUNTER — Other Ambulatory Visit: Payer: Self-pay | Admitting: Internal Medicine

## 2015-11-22 ENCOUNTER — Encounter: Payer: Self-pay | Admitting: Internal Medicine

## 2015-11-22 DIAGNOSIS — E785 Hyperlipidemia, unspecified: Secondary | ICD-10-CM

## 2015-11-22 DIAGNOSIS — E119 Type 2 diabetes mellitus without complications: Secondary | ICD-10-CM

## 2015-11-22 DIAGNOSIS — F4322 Adjustment disorder with anxiety: Secondary | ICD-10-CM | POA: Diagnosis not present

## 2015-11-23 DIAGNOSIS — F4320 Adjustment disorder, unspecified: Secondary | ICD-10-CM | POA: Diagnosis not present

## 2015-11-23 DIAGNOSIS — F4322 Adjustment disorder with anxiety: Secondary | ICD-10-CM | POA: Diagnosis not present

## 2015-11-25 ENCOUNTER — Encounter: Payer: Self-pay | Admitting: Internal Medicine

## 2015-11-29 DIAGNOSIS — F4322 Adjustment disorder with anxiety: Secondary | ICD-10-CM | POA: Diagnosis not present

## 2015-11-30 ENCOUNTER — Encounter: Payer: Self-pay | Admitting: Internal Medicine

## 2015-11-30 DIAGNOSIS — F4322 Adjustment disorder with anxiety: Secondary | ICD-10-CM | POA: Diagnosis not present

## 2015-12-01 ENCOUNTER — Other Ambulatory Visit: Payer: Self-pay | Admitting: Internal Medicine

## 2015-12-02 ENCOUNTER — Other Ambulatory Visit: Payer: Self-pay | Admitting: Internal Medicine

## 2015-12-02 DIAGNOSIS — F409 Phobic anxiety disorder, unspecified: Secondary | ICD-10-CM

## 2015-12-02 DIAGNOSIS — F5105 Insomnia due to other mental disorder: Principal | ICD-10-CM

## 2015-12-02 MED ORDER — ESZOPICLONE 2 MG PO TABS: 2.0000 mg | ORAL_TABLET | Freq: Every evening | ORAL | Status: DC | PRN

## 2015-12-03 ENCOUNTER — Other Ambulatory Visit (INDEPENDENT_AMBULATORY_CARE_PROVIDER_SITE_OTHER): Payer: BLUE CROSS/BLUE SHIELD

## 2015-12-03 DIAGNOSIS — E119 Type 2 diabetes mellitus without complications: Secondary | ICD-10-CM

## 2015-12-03 DIAGNOSIS — E785 Hyperlipidemia, unspecified: Secondary | ICD-10-CM | POA: Diagnosis not present

## 2015-12-03 DIAGNOSIS — R7989 Other specified abnormal findings of blood chemistry: Secondary | ICD-10-CM

## 2015-12-03 DIAGNOSIS — R945 Abnormal results of liver function studies: Secondary | ICD-10-CM

## 2015-12-03 LAB — LIPID PANEL
Cholesterol: 171 mg/dL (ref 0–200)
HDL: 49.2 mg/dL (ref >39.00)
LDL Cholesterol: 100 mg/dL — ABNORMAL HIGH (ref 0–99)
NonHDL: 121.68
Total CHOL/HDL Ratio: 3
Triglycerides: 110 mg/dL (ref 0.0–149.0)
VLDL: 22 mg/dL (ref 0.0–40.0)

## 2015-12-03 LAB — COMPREHENSIVE METABOLIC PANEL
ALT: 32 U/L (ref 0–35)
AST: 27 U/L (ref 0–37)
Albumin: 4.1 g/dL (ref 3.5–5.2)
Alkaline Phosphatase: 67 U/L (ref 39–117)
BUN: 10 mg/dL (ref 6–23)
CO2: 32 mEq/L (ref 19–32)
Calcium: 9.6 mg/dL (ref 8.4–10.5)
Chloride: 103 mEq/L (ref 96–112)
Creatinine, Ser: 0.59 mg/dL (ref 0.40–1.20)
GFR: 110.5 mL/min (ref >60.00)
Glucose, Bld: 108 mg/dL — ABNORMAL HIGH (ref 70–99)
Potassium: 4.3 mEq/L (ref 3.5–5.1)
Sodium: 139 mEq/L (ref 135–145)
Total Bilirubin: 0.5 mg/dL (ref 0.2–1.2)
Total Protein: 6.8 g/dL (ref 6.0–8.3)

## 2015-12-03 LAB — HEMOGLOBIN A1C: Hgb A1c MFr Bld: 6.2 % (ref 4.6–6.5)

## 2015-12-03 NOTE — Telephone Encounter (Signed)
Faxed script back to [facility]...Johny Chess

## 2015-12-04 DIAGNOSIS — F4320 Adjustment disorder, unspecified: Secondary | ICD-10-CM | POA: Diagnosis not present

## 2015-12-05 ENCOUNTER — Other Ambulatory Visit: Payer: Self-pay | Admitting: Internal Medicine

## 2015-12-05 ENCOUNTER — Encounter: Payer: Self-pay | Admitting: Internal Medicine

## 2015-12-05 DIAGNOSIS — D472 Monoclonal gammopathy: Secondary | ICD-10-CM

## 2015-12-05 LAB — PROTEIN ELECTROPHORESIS, SERUM
Abnormal Protein Band1: 0.2 g/dL
Albumin ELP: 3.9 g/dL (ref 3.8–4.8)
Alpha-1-Globulin: 0.3 g/dL (ref 0.2–0.3)
Alpha-2-Globulin: 0.8 g/dL (ref 0.5–0.9)
Beta 2: 0.3 g/dL (ref 0.2–0.5)
Beta Globulin: 0.4 g/dL (ref 0.4–0.6)
Gamma Globulin: 0.8 g/dL (ref 0.8–1.7)
Total Protein, Serum Electrophoresis: 6.6 g/dL (ref 6.1–8.1)

## 2015-12-10 DIAGNOSIS — M0579 Rheumatoid arthritis with rheumatoid factor of multiple sites without organ or systems involvement: Secondary | ICD-10-CM | POA: Diagnosis not present

## 2015-12-10 DIAGNOSIS — Z79899 Other long term (current) drug therapy: Secondary | ICD-10-CM | POA: Diagnosis not present

## 2015-12-10 DIAGNOSIS — M79643 Pain in unspecified hand: Secondary | ICD-10-CM | POA: Diagnosis not present

## 2015-12-21 DIAGNOSIS — F4320 Adjustment disorder, unspecified: Secondary | ICD-10-CM | POA: Diagnosis not present

## 2016-01-01 DIAGNOSIS — F4320 Adjustment disorder, unspecified: Secondary | ICD-10-CM | POA: Diagnosis not present

## 2016-01-01 DIAGNOSIS — F4310 Post-traumatic stress disorder, unspecified: Secondary | ICD-10-CM | POA: Diagnosis not present

## 2016-01-09 ENCOUNTER — Other Ambulatory Visit: Payer: Self-pay | Admitting: Internal Medicine

## 2016-01-09 ENCOUNTER — Encounter: Payer: Self-pay | Admitting: Internal Medicine

## 2016-01-09 DIAGNOSIS — D472 Monoclonal gammopathy: Secondary | ICD-10-CM

## 2016-01-14 ENCOUNTER — Telehealth: Payer: Self-pay | Admitting: Hematology and Oncology

## 2016-01-14 ENCOUNTER — Encounter: Payer: Self-pay | Admitting: Hematology and Oncology

## 2016-01-14 NOTE — Telephone Encounter (Signed)
Pt confirmed appt date/time, completed intake. Mailed pt letter

## 2016-01-18 DIAGNOSIS — F4320 Adjustment disorder, unspecified: Secondary | ICD-10-CM | POA: Diagnosis not present

## 2016-01-28 ENCOUNTER — Telehealth: Payer: Self-pay | Admitting: Hematology and Oncology

## 2016-01-28 NOTE — Telephone Encounter (Signed)
Patient called to cancel 7/11 new patient appointment with Dr. Alvy Bimler and does not wish to reschedule at this time. Per patient she will communicate with Dr. Ronnald Ramp (referring) that she has cxd appointment.

## 2016-01-29 ENCOUNTER — Ambulatory Visit: Payer: BLUE CROSS/BLUE SHIELD | Admitting: Hematology and Oncology

## 2016-01-31 DIAGNOSIS — F4320 Adjustment disorder, unspecified: Secondary | ICD-10-CM | POA: Diagnosis not present

## 2016-02-21 DIAGNOSIS — F4320 Adjustment disorder, unspecified: Secondary | ICD-10-CM | POA: Diagnosis not present

## 2016-03-05 ENCOUNTER — Other Ambulatory Visit: Payer: Self-pay | Admitting: Internal Medicine

## 2016-03-05 DIAGNOSIS — F418 Other specified anxiety disorders: Secondary | ICD-10-CM

## 2016-03-05 DIAGNOSIS — Z1231 Encounter for screening mammogram for malignant neoplasm of breast: Secondary | ICD-10-CM | POA: Diagnosis not present

## 2016-03-05 LAB — HM MAMMOGRAPHY

## 2016-03-06 DIAGNOSIS — F4322 Adjustment disorder with anxiety: Secondary | ICD-10-CM | POA: Diagnosis not present

## 2016-03-11 ENCOUNTER — Encounter: Payer: Self-pay | Admitting: Internal Medicine

## 2016-03-13 DIAGNOSIS — F4320 Adjustment disorder, unspecified: Secondary | ICD-10-CM | POA: Diagnosis not present

## 2016-03-19 DIAGNOSIS — F4322 Adjustment disorder with anxiety: Secondary | ICD-10-CM | POA: Diagnosis not present

## 2016-03-20 DIAGNOSIS — F4320 Adjustment disorder, unspecified: Secondary | ICD-10-CM | POA: Diagnosis not present

## 2016-03-25 ENCOUNTER — Encounter: Payer: Self-pay | Admitting: Hematology and Oncology

## 2016-03-25 ENCOUNTER — Telehealth: Payer: Self-pay | Admitting: Hematology and Oncology

## 2016-03-25 DIAGNOSIS — F4320 Adjustment disorder, unspecified: Secondary | ICD-10-CM | POA: Diagnosis not present

## 2016-03-25 DIAGNOSIS — M0579 Rheumatoid arthritis with rheumatoid factor of multiple sites without organ or systems involvement: Secondary | ICD-10-CM | POA: Diagnosis not present

## 2016-03-25 LAB — BASIC METABOLIC PANEL
BUN: 13 (ref 4–21)
Creatinine: 0.5 (ref 0.5–1.1)
Glucose: 89
Potassium: 5.1 (ref 3.4–5.3)
Sodium: 139 (ref 137–147)

## 2016-03-25 LAB — CBC AND DIFFERENTIAL
HCT: 42 (ref 36–46)
Hemoglobin: 13.7 (ref 12.0–16.0)
WBC: 6.2

## 2016-03-25 LAB — HEPATIC FUNCTION PANEL
ALT: 38 — AB (ref 7–35)
AST: 23 (ref 13–35)
Alkaline Phosphatase: 103 (ref 25–125)
Bilirubin, Total: 0.4

## 2016-03-25 NOTE — Telephone Encounter (Signed)
Pt called in to reschedule and new pt appt she cancelled earlier.  Pt confirmed appt, completed intake, mailed pt letter, in basket referring provider.

## 2016-03-28 DIAGNOSIS — F4322 Adjustment disorder with anxiety: Secondary | ICD-10-CM | POA: Diagnosis not present

## 2016-04-03 ENCOUNTER — Encounter: Payer: Self-pay | Admitting: Family Medicine

## 2016-04-03 ENCOUNTER — Ambulatory Visit (INDEPENDENT_AMBULATORY_CARE_PROVIDER_SITE_OTHER): Payer: BLUE CROSS/BLUE SHIELD | Admitting: Family Medicine

## 2016-04-03 VITALS — BP 126/78 | HR 71 | Ht 66.25 in | Wt 161.1 lb

## 2016-04-03 DIAGNOSIS — M8580 Other specified disorders of bone density and structure, unspecified site: Secondary | ICD-10-CM

## 2016-04-03 DIAGNOSIS — E119 Type 2 diabetes mellitus without complications: Secondary | ICD-10-CM

## 2016-04-03 DIAGNOSIS — E785 Hyperlipidemia, unspecified: Secondary | ICD-10-CM | POA: Diagnosis not present

## 2016-04-03 DIAGNOSIS — Z86010 Personal history of colonic polyps: Secondary | ICD-10-CM

## 2016-04-03 DIAGNOSIS — F409 Phobic anxiety disorder, unspecified: Secondary | ICD-10-CM

## 2016-04-03 DIAGNOSIS — F418 Other specified anxiety disorders: Secondary | ICD-10-CM | POA: Diagnosis not present

## 2016-04-03 DIAGNOSIS — F5105 Insomnia due to other mental disorder: Secondary | ICD-10-CM

## 2016-04-03 LAB — POCT GLYCOSYLATED HEMOGLOBIN (HGB A1C): Hemoglobin A1C: 6

## 2016-04-03 NOTE — Assessment & Plan Note (Addendum)
Off and on 55yrs.  More anxiety then depression now- W 8 yrs ago.   - Mom died 2022-08-01 - Sister died around same time in 08/01/22 - On [place],  - goes every week.  Couples counseling as well.

## 2016-04-03 NOTE — Patient Instructions (Signed)
    If you have insomnia or difficulty sleeping, this information is for you:  - Avoid caffeinated beverages after lunch,  no alcoholic beverages,  no eating within 2-3 hours of lying down,  avoid exposure to blue light before bed,  avoid daytime naps, and  needs to maintain a regular sleep schedule- go to sleep and wake up around the same time every night.   - Resolve concerns or worries before entering bedroom:  Discussed relaxation techniques with patient and to keep a journal to write down fears\ worries.  I suggested seeing a counselor for CBT.   - Recommend patient meditate or do deep breathing exercises to help relax.   Incorporate the use of white noise machines or listen to "sleep meditation music", or recordings of guided meditations for sleep from YouTube which are free, such as  "guided meditation for detachment from over thinking"  by Mayford Knife.

## 2016-04-03 NOTE — Assessment & Plan Note (Addendum)
dexa 2013.   - suppose to be on Ca and vit D as her treatment for osteopenia but she has not taken them. Was sure of dose.  - At least 1200 mg daily of calcium and I advised 5000 international units vitamin D tablet daily  We'll check blood work in near future.

## 2016-04-03 NOTE — Assessment & Plan Note (Addendum)
Dx about 8-9 yrs ago as a pre-diabetic.  Diet controlled- well controlled- less than 6.5 per pt.    6.0 today in office POCT A1C testing - excellent.  - Referral placed for diabetic eye exam  - Referral placed for diabetic foot care and yearly examination to podiatry

## 2016-04-03 NOTE — Progress Notes (Signed)
New patient office visit note:  Impression and Recommendations:    1. Diabetes mellitus type 2 in nonobese (HCC)   2. Hyperlipidemia with target LDL less than 100   3. Osteopenia   4. Depression with anxiety   5. Insomnia due to anxiety and fear   6. Personal history of colonic polyps- per patient recall approx 2007    After reviewing all of her medical history and going through and answering all her questions, I told patient we did not have time to specifically address additional complaints\concerns today and asked her to make a follow-up office visit, multiple if she needs to.   Explained that 2 items is usually the most we can get through in the time allotted of a 15 minute office visit.    - Patient asked for referral to diabetic nutritionist. This was placed today. Explained that they could address her concerns about not being able to lose weight.  Diabetes mellitus type 2 in nonobese [facility]) Dx about 8-9 yrs ago as a pre-diabetic.  Diet controlled- well controlled- less than 6.5 per pt.    6.0 today in office POCT A1C testing - excellent.  - Referral placed for diabetic eye exam  - Referral placed for diabetic foot care and yearly examination to podiatry  Hyperlipidemia with target LDL less than 100 4-5 yrs ago->  Controlled thru med. Tolerated well  Osteopenia dexa 2013.   - suppose to be on Ca and vit D as her treatment for osteopenia but she has not taken them. Was sure of dose.  - At least 1200 mg daily of calcium and I advised 5000 international units vitamin D tablet daily  We'll check blood work in near future.  Depression with anxiety Off and on 13yrs.  More anxiety then depression now- W 8 yrs ago.   - Mom died 2022/08/04 - Sister died around same time in 08-04-22 - On [place],  - goes every week.  Couples counseling as well.   Insomnia due to anxiety and fear Menopausal issue as well.  8-10 yrs issue and for the last 1 year- it has  been really bad.  - Has appt with sleep specialist already in a couple wks.  does snore and possibly hold breath  Personal history of colonic polyps- per patient recall approx 2007 Inflammatory polyp in 2008 [facility]) Is routine risk (q 10 year colonoscopy)- per Dr Carlean Purl of GI  Pt was in the office today for 50+ minutes, with over 50% time spent in face to face counseling of various medical concerns and in coordination of care  Orders Placed This Encounter  Procedures  . Urine Microalbumin w/creat. ratio  . Amb ref to Medical Nutrition Therapy-MNT  . Ambulatory referral to Ophthalmology  . Ambulatory referral to Podiatry  . POCT glycosylated hemoglobin (Hb A1C)    New Prescriptions   No medications on file    Modified Medications   No medications on file    Discontinued Medications   CALCIUM CARBONATE-VIT D-MIN (CALCIUM 1200 PO)    Take by mouth daily.   CO-ENZYME Q-10 30 MG CAPSULE    Take 30 mg by mouth 3 (three) times daily.   DEXTROSE 5 % SOLN 50 ML WITH METHOTREXATE 25 MG/ML SOLN 40 MG/M2    60 mg/m2.    VITAMIN D, ERGOCALCIFEROL, (DRISDOL) 50000 UNITS CAPS CAPSULE         The patient was counseled, risk factors were  discussed, anticipatory guidance given.  Gross side effects, risk and benefits, and alternatives of medications discussed with patient.  Patient is aware that all medications have potential side effects and we are unable to predict every side effect or drug-drug interaction that may occur.  Expresses verbal understanding and consents to current therapy plan and treatment regimen.  Return for Fasting blood work; then f/up ov  to discuss 2 most important issues for you and review labs.   Please see AVS handed out to patient at the end of our visit for further patient instructions/ counseling done pertaining to today's office visit.    Note: This document was prepared using Dragon voice recognition software and may include unintentional dictation  errors.  ----------------------------------------------------------------------------------------------------------------------    Subjective:    Chief Complaint  Patient presents with  . Establish Care    HPI: Michele Adkins is a pleasant 60 y.o. female who presents to [place] [facility] today to review their medical history with me and establish care.   - [place]- Dr Ronnald Ramp on [address] Was her last PCP.    Married- Training and development officer. No children- never pregnant.   Lives at home with husband and her stepdad. Works as a Designer, television/film set. Has her BS in business.  Went to integrative medicine- Dr. Sharol Roussel.  But apparently she could not afford to continue to go. She has many questions about tests that they do there which I'm not familiar with today. Explained if she would like those tests she would have to go back and get them, as integrative medicine physicians are different than most board-certified family practitioners.  - She has many gross concerns today about RA,  her "general inflammatory condition" but Dr. Cruzita Lederer said she had, her weight, digestive issues, nasal allergies, sinus congestion, sleep/insomnia issues, anxiety, depression, memory loss of just losing her nouns.     After reviewing all of her medical history in going through and answering all her questions, I told patient we did not have time to specifically address these concerns today and asked her to make a follow-up office visit, multiple if she needs to. Explained that to items is usually the most we can get through in the time allotted of a 15 minute office visit.    Wt Readings from Last 3 Encounters:  04/03/16 161 lb 1.6 oz (73.1 kg)  08/09/15 163 lb (73.9 kg)  07/10/15 162 lb (73.5 kg)   BP Readings from Last 3 Encounters:  04/03/16 126/78  07/10/15 138/62  11/16/14 118/78   Pulse Readings from Last 3 Encounters:  04/03/16 71  07/10/15 78  11/16/14 68   BMI Readings from Last 3 Encounters:   04/03/16 25.81 kg/m  08/09/15 26.31 kg/m  07/10/15 26.15 kg/m      Patient Active Problem List   Diagnosis Date Noted  . 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 Renal mass, right 07/28/2015  . Elevated LFTs 07/11/2015  . Allergic rhinitis due to pollen 05/29/2014  . 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 Menopausal vaginal dryness 01/26/2014  . Depression with anxiety 03/11/2013  . Routine general medical examination at a health care facility 03/01/2012  . Diabetes mellitus type 2 in nonobese ([place]) 03/01/2012  . Hyperlipidemia with target LDL less than 100 03/01/2012  . Osteopenia 03/01/2012  . Rheumatoid arthritis ([place]) 03/01/2012  . Personal history of colonic polyps- per patient recall approx 2007 03/01/2012     Past Medical History:  Diagnosis Date  . Allergy   .  Depression   . Diabetes mellitus without complication ([place])   . Hyperlipidemia   . Osteopenia 2007  . Rheumatoid arthritis(714.0) 2001  . Sleep disorder      Past Medical History:  Diagnosis Date  . Allergy   . Depression   . Diabetes mellitus without complication ([place])   . Hyperlipidemia   . Osteopenia 2007  . Rheumatoid arthritis(714.0) 2001  . Sleep disorder      Past Surgical History:  Procedure Laterality Date  . TONSILLECTOMY       Family History  Problem Relation Age of Onset  . Diabetes Mother   . Hypertension Father   . Diabetes Brother   . Colon cancer Maternal Grandfather 63  . Diabetes Sister   . Alcohol abuse Neg Hx   . Cancer Neg Hx   . Depression Neg Hx   . Early death Neg Hx   . Heart disease Neg Hx   . Hyperlipidemia Neg Hx   . Kidney disease Neg Hx   . Stroke Neg Hx      History  Drug Use No    History  Alcohol Use No    History  Smoking Status  . Never Smoker  Smokeless Tobacco  . Never Used     Patient's Medications  New Prescriptions   No medications on file  Previous Medications   ASPIRIN 81 MG TABLET    Take 81 mg by mouth  daily.   ATORVASTATIN (LIPITOR) 20 MG TABLET    TAKE 1 TABLET BY MOUTH DAILY.   CONJUGATED ESTROGENS (PREMARIN) VAGINAL CREAM    Place 1 Applicatorful vaginally daily.   ESZOPICLONE (LUNESTA) 2 MG TABS TABLET    Take 1 tablet (2 mg total) by mouth at bedtime as needed for sleep. Take immediately before bedtime   FLUOXETINE (PROZAC) 40 MG CAPSULE    TAKE 1 CAPSULE BY MOUTH DAILY.   FOLIC ACID (FOLVITE) 1 MG TABLET       METHOTREXATE 250 MG/10ML INJECTION    Inject 250 mg into the muscle once a week.   OMEGA-3 FATTY ACIDS (SUPER OMEGA-3) 1000 MG CAPS    Take 1 capsule by mouth 2 (two) times daily.   PROBIOTIC PRODUCT (PROBIOTIC-10 PO)    Take 1 tablet by mouth daily.   TURMERIC PO    Take 120 mg by mouth 2 (two) times daily.  Modified Medications   No medications on file  Discontinued Medications   CALCIUM CARBONATE-VIT D-MIN (CALCIUM 1200 PO)    Take by mouth daily.   CO-ENZYME Q-10 30 MG CAPSULE    Take 30 mg by mouth 3 (three) times daily.   DEXTROSE 5 % SOLN 50 ML WITH METHOTREXATE 25 MG/ML SOLN 40 MG/M2    60 mg/m2.    VITAMIN D, ERGOCALCIFEROL, (DRISDOL) 50000 UNITS CAPS CAPSULE        Allergies: Sulfa drugs cross reactors  ROS negative except for as above in history of present illness and below in ROS iReview of Systems  Constitutional:       Complains of generalized "body inflammation "-as told to her by the integrative medicine specialist Dr. Sharol Roussel  HENT: Negative.        Allergies/hayfever  Gastrointestinal:       Has "digestive issues "  Neurological:       Memory loss- losing ability to remember nouns   Psychiatric/Behavioral: Negative for depression. The patient has insomnia.        Anxiety     Objective:  Blood pressure 126/78, pulse 71, height 5' 6.25" (1.683 m), weight 161 lb 1.6 oz (73.1 kg). Body mass index is 25.81 kg/m. General: Well Developed, well nourished, and in no acute distress.  Neuro: Alert and oriented x3, extra-ocular muscles intact,  sensation grossly intact.  HEENT: Normocephalic, atraumatic, pupils equal round reactive to light, neck supple, no gross masses, no carotid bruits, no JVD apprec Skin: no gross suspicious lesions or rashes  Cardiac: Regular rate and rhythm, no murmurs rubs or gallops.  Respiratory: Essentially clear to auscultation bilaterally. Not using accessory muscles, speaking in full sentences.  Abdominal: Soft, not grossly distended Musculoskeletal: Ambulates w/o diff, FROM * 4 ext.  Vasc: less 2 sec cap RF, warm and pink  Psych:  No HI/SI, judgement and insight good, Euthymic mood. Full Affect.

## 2016-04-03 NOTE — Assessment & Plan Note (Addendum)
Menopausal issue as well.  8-10 yrs issue and for the last 1 year- it has been really bad.  - Has appt with sleep specialist already in a couple wks.  does snore and possibly hold breath

## 2016-04-03 NOTE — Assessment & Plan Note (Addendum)
4-5 yrs ago->  Controlled thru med. Tolerated well

## 2016-04-04 LAB — MICROALBUMIN / CREATININE URINE RATIO
Creatinine, Urine: 16 mg/dL — ABNORMAL LOW (ref 20–320)
Microalb Creat Ratio: 13 mcg/mg creat (ref <30)
Microalb, Ur: 0.2 mg/dL

## 2016-04-05 NOTE — Assessment & Plan Note (Signed)
Inflammatory polyp in 2008 [facility]) Is routine risk (q 10 year colonoscopy)- per Dr Carlean Purl of GI

## 2016-04-10 DIAGNOSIS — F4320 Adjustment disorder, unspecified: Secondary | ICD-10-CM | POA: Diagnosis not present

## 2016-04-16 ENCOUNTER — Other Ambulatory Visit: Payer: BLUE CROSS/BLUE SHIELD

## 2016-04-16 DIAGNOSIS — F4320 Adjustment disorder, unspecified: Secondary | ICD-10-CM | POA: Diagnosis not present

## 2016-04-17 ENCOUNTER — Ambulatory Visit (HOSPITAL_BASED_OUTPATIENT_CLINIC_OR_DEPARTMENT_OTHER): Payer: BLUE CROSS/BLUE SHIELD | Admitting: Hematology and Oncology

## 2016-04-17 ENCOUNTER — Telehealth: Payer: Self-pay | Admitting: Hematology and Oncology

## 2016-04-17 ENCOUNTER — Encounter: Payer: Self-pay | Admitting: Hematology and Oncology

## 2016-04-17 DIAGNOSIS — D472 Monoclonal gammopathy: Secondary | ICD-10-CM | POA: Diagnosis not present

## 2016-04-17 DIAGNOSIS — IMO0001 Reserved for inherently not codable concepts without codable children: Secondary | ICD-10-CM | POA: Insufficient documentation

## 2016-04-17 DIAGNOSIS — M05711 Rheumatoid arthritis with rheumatoid factor of right shoulder without organ or systems involvement: Secondary | ICD-10-CM

## 2016-04-17 DIAGNOSIS — R030 Elevated blood-pressure reading, without diagnosis of hypertension: Secondary | ICD-10-CM

## 2016-04-17 DIAGNOSIS — M069 Rheumatoid arthritis, unspecified: Secondary | ICD-10-CM

## 2016-04-17 NOTE — Telephone Encounter (Signed)
Gave patient avs report and appointments for October  °

## 2016-04-17 NOTE — Assessment & Plan Note (Signed)
She has chronic arthritis pain from rheumatoid arthritis and is on methotrexate treatment. I shared with the patient that I had patient with false-positive SPEP due to diagnosis of autoimmune disorder.

## 2016-04-17 NOTE — Progress Notes (Signed)
[place] NOTE  Patient Care Team: Mellody Dance, DO as PCP - General (Family Medicine) Valinda Party, MD (Rheumatology) Calvert Cantor, MD as Consulting Physician (Ophthalmology) Gatha Mayer, MD as Consulting Physician (Gastroenterology)  CHIEF COMPLAINTS/PURPOSE OF CONSULTATION:  Abnormal serum protein electrophoresis  HISTORY OF PRESENTING ILLNESS:  Michele Adkins 60 y.o. female is here because of recent abnormal serum protein electrophoresis. The patient have diagnosis of rheumatoid arthritis and is on methotrexate for the last 3 years. She has complained of nonspecific arthritis pain. During workup to check for immunoglobulin levels in December 2016, a slight band of abnormalities was suspected.  That was repeated in May 2017 which confirmed abnormal monoclonal paraproteinemia. She is hence referred here for further evaluation She denies history of abnormal bone fracture. Patient denies history of recurrent infection or atypical infections such as shingles of meningitis. Denies chills, night sweats, anorexia or abnormal weight loss.  MEDICAL HISTORY:  Past Medical History:  Diagnosis Date  . Allergy   . Depression   . Diabetes mellitus without complication ([place])   . Hyperlipidemia   . Osteopenia 2007  . Rheumatoid arthritis(714.0) 2001  . Sleep disorder     SURGICAL HISTORY: Past Surgical History:  Procedure Laterality Date  . TONSILLECTOMY      SOCIAL HISTORY: Social History   Social History  . Marital status: Married    Spouse name: Training and development officer  . Number of children: N/A  . Years of education: N/A   Occupational History  . Not on file.   Social History Main Topics  . Smoking status: Never Smoker  . Smokeless tobacco: Never Used  . Alcohol use No  . Drug use: No  . Sexual activity: Yes    Partners: Male   Other Topics Concern  . Not on file   Social History Narrative  . No narrative on file    FAMILY HISTORY: Family  History  Problem Relation Age of Onset  . Diabetes Mother   . Hypertension Father   . Diabetes Brother   . Colon cancer Maternal Grandfather 51  . Diabetes Sister   . Cancer Sister     breast ca  . Alcohol abuse Neg Hx   . Depression Neg Hx   . Early death Neg Hx   . Heart disease Neg Hx   . Hyperlipidemia Neg Hx   . Kidney disease Neg Hx   . Stroke Neg Hx     ALLERGIES:  is allergic to sulfa drugs cross reactors.  MEDICATIONS:  Current Outpatient Prescriptions  Medication Sig Dispense Refill  . aspirin 81 MG tablet Take 81 mg by mouth daily.    Marland Kitchen atorvastatin (LIPITOR) 20 MG tablet TAKE 1 TABLET BY MOUTH DAILY. 90 tablet 1  . conjugated estrogens (PREMARIN) vaginal cream Place 1 Applicatorful vaginally daily. (Patient taking differently: Place 1 Applicatorful vaginally as needed. ) 30 g 11  . eszopiclone (LUNESTA) 2 MG TABS tablet Take 1 tablet (2 mg total) by mouth at bedtime as needed for sleep. Take immediately before bedtime 90 tablet 1  . FLUoxetine (PROZAC) 40 MG capsule TAKE 1 CAPSULE BY MOUTH DAILY. 90 capsule 3  . folic acid (FOLVITE) 1 MG tablet Take 1 mg by mouth daily.   3  . methotrexate 250 MG/10ML injection Inject 60 mg into the skin once a week.   1  . Omega-3 Fatty Acids (SUPER OMEGA-3) 1000 MG CAPS Take 1 capsule by mouth 2 (two) times daily.    Marland Kitchen  Probiotic Product (PROBIOTIC-10 PO) Take 1 tablet by mouth daily.    . TURMERIC PO Take 120 mg by mouth 2 (two) times daily.     No current facility-administered medications for this visit.     REVIEW OF SYSTEMS:   Eyes: Denies blurriness of vision, double vision or watery eyes Ears, nose, mouth, throat, and face: Denies mucositis or sore throat Respiratory: Denies cough, dyspnea or wheezes Cardiovascular: Denies palpitation, chest discomfort or lower extremity swelling Gastrointestinal:  Denies nausea, heartburn or change in bowel habits Skin: Denies abnormal skin rashes Lymphatics: Denies new lymphadenopathy  or easy bruising Neurological:Denies numbness, tingling or new weaknesses Behavioral/Psych: Mood is stable, no new changes  All other systems were reviewed with the patient and are negative.  PHYSICAL EXAMINATION: ECOG PERFORMANCE STATUS: 1 - Symptomatic but completely ambulatory  Vitals:   04/17/16 1402  BP: (!) 161/70  Pulse: 81  Resp: 18  Temp: 98.1 F (36.7 C)   Filed Weights   04/17/16 1402  Weight: 163 lb 9.6 oz (74.2 kg)    GENERAL:alert, no distress and comfortable SKIN: skin color, texture, turgor are normal, no rashes or significant lesions EYES: normal, conjunctiva are pink and non-injected, sclera clear OROPHARYNX:no exudate, no erythema and lips, buccal mucosa, and tongue normal  NECK: supple, thyroid normal size, non-tender, without nodularity LYMPH:  no palpable lymphadenopathy in the cervical, axillary or inguinal LUNGS: clear to auscultation and percussion with normal breathing effort HEART: regular rate & rhythm and no murmurs and no lower extremity edema ABDOMEN:abdomen soft, non-tender and normal bowel sounds Musculoskeletal:no cyanosis of digits and no clubbing  PSYCH: alert & oriented x 3 with fluent speech NEURO: no focal motor/sensory deficits  LABORATORY DATA:  I have reviewed the data as listed Lab Results  Component Value Date   WBC 5.0 06/07/2015   HGB 13.3 06/07/2015   HCT 40 06/07/2015   MCV 89.8 05/29/2014   PLT 385 06/07/2015    ASSESSMENT & PLAN:  MGUS (monoclonal gammopathy of unknown significance) To rule out multiple myeloma, I recommend complete blood work, 24 hour urine collection for UPEP and skeletal survey to rule out multiple myeloma  Rheumatoid arthritis (HCC) She has chronic arthritis pain from rheumatoid arthritis and is on methotrexate treatment. I shared with the patient that I had patient with false-positive SPEP due to diagnosis of autoimmune disorder.  Elevated blood pressure She has elevated blood pressure likely  due to whitecoat hypertension The patient is very anxious today. We will recheck her blood pressure in the next visit. Her most recent blood pressure was normal in another physician's office   Orders Placed This Encounter  Procedures  . DG Bone Survey Met    Standing Status:   Future    Standing Expiration Date:   06/17/2017    Order Specific Question:   Reason for Exam (SYMPTOM  OR DIAGNOSIS REQUIRED)    Answer:   staging myeloma    Order Specific Question:   Is the patient pregnant?    Answer:   No    Order Specific Question:   Preferred imaging location?    Answer:   [facility]  . CBC with Differential/Platelet    Standing Status:   Future    Standing Expiration Date:   06/17/2017  . Comprehensive metabolic panel    Standing Status:   Future    Standing Expiration Date:   06/17/2017  . Kappa/lambda light chains    Standing Status:   Future  Standing Expiration Date:   06/17/2017  . Multiple Myeloma Panel (SPEP&IFE w/QIG)    Standing Status:   Future    Standing Expiration Date:   06/17/2017  . IFE/Light Chains/TP Qn, 24-Hr Ur    Standing Status:   Future    Standing Expiration Date:   06/17/2017    All questions were answered. The patient knows to call the clinic with any problems, questions or concerns. I spent 40 minutes counseling the patient face to face. The total time spent in the appointment was 55 minutes and more than 50% was on counseling.     Heath Lark, MD 04/17/16 3:06 PM

## 2016-04-17 NOTE — Assessment & Plan Note (Signed)
To rule out multiple myeloma, I recommend complete blood work, 24 hour urine collection for UPEP and skeletal survey to rule out multiple myeloma

## 2016-04-17 NOTE — Assessment & Plan Note (Signed)
She has elevated blood pressure likely due to whitecoat hypertension The patient is very anxious today. We will recheck her blood pressure in the next visit. Her most recent blood pressure was normal in another physician's office

## 2016-04-21 DIAGNOSIS — R0683 Snoring: Secondary | ICD-10-CM | POA: Diagnosis not present

## 2016-04-21 DIAGNOSIS — F419 Anxiety disorder, unspecified: Secondary | ICD-10-CM | POA: Diagnosis not present

## 2016-04-21 DIAGNOSIS — G4721 Circadian rhythm sleep disorder, delayed sleep phase type: Secondary | ICD-10-CM | POA: Diagnosis not present

## 2016-04-21 DIAGNOSIS — G4700 Insomnia, unspecified: Secondary | ICD-10-CM | POA: Diagnosis not present

## 2016-04-23 ENCOUNTER — Ambulatory Visit (HOSPITAL_COMMUNITY)
Admission: RE | Admit: 2016-04-23 | Discharge: 2016-04-23 | Disposition: A | Discharge status: Home / Self Care | Payer: BLUE CROSS/BLUE SHIELD | Source: Ambulatory Visit | Attending: Hematology and Oncology | Admitting: Hematology and Oncology

## 2016-04-23 ENCOUNTER — Other Ambulatory Visit (HOSPITAL_BASED_OUTPATIENT_CLINIC_OR_DEPARTMENT_OTHER): Payer: BLUE CROSS/BLUE SHIELD

## 2016-04-23 ENCOUNTER — Encounter (HOSPITAL_COMMUNITY): Payer: Self-pay

## 2016-04-23 DIAGNOSIS — F4320 Adjustment disorder, unspecified: Secondary | ICD-10-CM | POA: Diagnosis not present

## 2016-04-23 DIAGNOSIS — D472 Monoclonal gammopathy: Secondary | ICD-10-CM

## 2016-04-23 LAB — COMPREHENSIVE METABOLIC PANEL
ALT: 23 U/L (ref 0–55)
AST: 24 U/L (ref 5–34)
Albumin: 3.8 g/dL (ref 3.5–5.0)
Alkaline Phosphatase: 95 U/L (ref 40–150)
Anion Gap: 10 mEq/L (ref 3–11)
BUN: 10.4 mg/dL (ref 7.0–26.0)
CO2: 28 mEq/L (ref 22–29)
Calcium: 10 mg/dL (ref 8.4–10.4)
Chloride: 103 mEq/L (ref 98–109)
Creatinine: 0.7 mg/dL (ref 0.6–1.1)
EGFR: >90 mL/min/{1.73_m2} (ref >90)
Glucose: 96 mg/dl (ref 70–140)
Potassium: 4.7 mEq/L (ref 3.5–5.1)
Sodium: 140 mEq/L (ref 136–145)
Total Bilirubin: 0.49 mg/dL (ref 0.20–1.20)
Total Protein: 7.5 g/dL (ref 6.4–8.3)

## 2016-04-23 LAB — CBC WITH DIFFERENTIAL/PLATELET
BASO%: 0.3 % (ref 0.0–2.0)
Basophils Absolute: 0 10*3/uL (ref 0.0–0.1)
EOS%: 1.2 % (ref 0.0–7.0)
Eosinophils Absolute: 0.1 10*3/uL (ref 0.0–0.5)
HCT: 41 % (ref 34.8–46.6)
HGB: 13.7 g/dL (ref 11.6–15.9)
LYMPH%: 34.4 % (ref 14.0–49.7)
MCH: 29.8 pg (ref 25.1–34.0)
MCHC: 33.4 g/dL (ref 31.5–36.0)
MCV: 89.3 fL (ref 79.5–101.0)
MONO#: 0.5 10*3/uL (ref 0.1–0.9)
MONO%: 8.6 % (ref 0.0–14.0)
NEUT#: 3.3 10*3/uL (ref 1.5–6.5)
NEUT%: 55.5 % (ref 38.4–76.8)
Platelets: 352 10*3/uL (ref 145–400)
RBC: 4.59 10*6/uL (ref 3.70–5.45)
RDW: 13.7 % (ref 11.2–14.5)
WBC: 6 10*3/uL (ref 3.9–10.3)
lymph#: 2.1 10*3/uL (ref 0.9–3.3)

## 2016-04-24 LAB — UIFE/LIGHT CHAINS/TP QN, 24-HR UR
FR KAPPA LT CH,24HR: 3 mg/24 hr
FR LAMBDA LT CH,24HR: 0 mg/24 hr
Free Kappa Lt Chains,Ur: 1 mg/L — ABNORMAL LOW (ref 1.35–24.19)
Free Lambda Lt Chains,Ur: 0.05 mg/L — ABNORMAL LOW (ref 0.24–6.66)
Kappa/Lambda Ratio,U: 20 — ABNORMAL HIGH (ref 2.04–10.37)
PROTEIN,TOTAL,URINE: 7.6 mg/dL
Prot,24hr calculated: 228 mg/24 hr — ABNORMAL HIGH (ref 30–150)

## 2016-04-24 LAB — KAPPA/LAMBDA LIGHT CHAINS
Ig Kappa Free Light Chain: 16.9 mg/L (ref 3.3–19.4)
Ig Lambda Free Light Chain: 16.5 mg/L (ref 5.7–26.3)
Kappa/Lambda FluidC Ratio: 1.02 (ref 0.26–1.65)

## 2016-04-28 LAB — MULTIPLE MYELOMA PANEL, SERUM
Albumin SerPl Elph-Mcnc: 3.9 g/dL (ref 2.9–4.4)
Albumin/Glob SerPl: 1.3 (ref 0.7–1.7)
Alpha 1: 0.3 g/dL (ref 0.0–0.4)
Alpha2 Glob SerPl Elph-Mcnc: 0.9 g/dL (ref 0.4–1.0)
B-Globulin SerPl Elph-Mcnc: 1.1 g/dL (ref 0.7–1.3)
Gamma Glob SerPl Elph-Mcnc: 0.8 g/dL (ref 0.4–1.8)
Globulin, Total: 3.1 g/dL (ref 2.2–3.9)
IgA, Qn, Serum: 148 mg/dL (ref 87–352)
IgG, Qn, Serum: 850 mg/dL (ref 700–1600)
IgM, Qn, Serum: 95 mg/dL (ref 26–217)
Total Protein: 7 g/dL (ref 6.0–8.5)

## 2016-04-30 ENCOUNTER — Ambulatory Visit: Payer: BLUE CROSS/BLUE SHIELD | Admitting: Family Medicine

## 2016-04-30 ENCOUNTER — Encounter: Payer: Self-pay | Admitting: Hematology and Oncology

## 2016-05-02 ENCOUNTER — Encounter: Payer: Self-pay | Admitting: Hematology and Oncology

## 2016-05-02 ENCOUNTER — Ambulatory Visit (HOSPITAL_BASED_OUTPATIENT_CLINIC_OR_DEPARTMENT_OTHER): Payer: BLUE CROSS/BLUE SHIELD | Admitting: Hematology and Oncology

## 2016-05-02 ENCOUNTER — Telehealth: Payer: Self-pay | Admitting: *Deleted

## 2016-05-02 ENCOUNTER — Other Ambulatory Visit: Payer: Self-pay | Admitting: Internal Medicine

## 2016-05-02 DIAGNOSIS — R778 Other specified abnormalities of plasma proteins: Secondary | ICD-10-CM | POA: Diagnosis not present

## 2016-05-02 NOTE — Assessment & Plan Note (Signed)
She was originally referred here because of abnormal SPEP. I have repeated her blood work with repeat myeloma panel including serum free light chain, immunofixation and 24-hour urine collection for free light chain. All her test results came back within normal limits. The chances of [place] missing a diagnosis of MGUS/multiple myeloma is less than 1% I suspect she may be false positive with her recent blood work. She does not need to return here for future follow-up

## 2016-05-02 NOTE — Telephone Encounter (Signed)
LVM for pt offering appt w/ Dr. Alvy Bimler today at 1:30 pm instead of on Monday.  Pt sent My Chart message asking for explanation of some of her lab results.  Dr. Alvy Bimler needs to discuss on office visit,  Not over the phone or by My Chart.  Asked pt to call back if she wants to come today or keep her appt on Monday as scheduled.

## 2016-05-02 NOTE — Progress Notes (Signed)
[facility] OFFICE PROGRESS NOTE  Michele Dance, DO SUMMARY OF HEMATOLOGIC HISTORY:  Michele Adkins 60 y.o. female is here because of recent abnormal serum protein electrophoresis. The patient have diagnosis of rheumatoid arthritis and is on methotrexate for the last 3 years. She has complained of nonspecific arthritis pain. During workup to check for immunoglobulin levels in December 2016, a slight band of abnormalities was suspected.  That was repeated in May 2017 which confirmed abnormal monoclonal paraproteinemia. She is hence referred here for further evaluation She denies history of abnormal bone fracture. Patient denies history of recurrent infection or atypical infections such as shingles of meningitis. Denies chills, night sweats, anorexia or abnormal weight loss.  INTERVAL HISTORY: Michele Adkins 60 y.o. female returns for further follow-up. She had extensive workup to exclude MGUS/multiple myeloma and she returns today to review test results Otherwise, she feels well  I have reviewed the past medical history, past surgical history, social history and family history with the patient and they are unchanged from previous note.  ALLERGIES:  is allergic to sulfa drugs cross reactors.  MEDICATIONS:  Current Outpatient Prescriptions  Medication Sig Dispense Refill  . aspirin 81 MG tablet Take 81 mg by mouth daily.    Marland Kitchen atorvastatin (LIPITOR) 20 MG tablet TAKE 1 TABLET BY MOUTH DAILY. 90 tablet 1  . conjugated estrogens (PREMARIN) vaginal cream Place 1 Applicatorful vaginally daily. (Patient taking differently: Place 1 Applicatorful vaginally as needed. ) 30 g 11  . eszopiclone (LUNESTA) 1 MG TABS tablet TAKE 2 TABLETS BY MOUTH AT BEDTIME AS NEEDED FOR SLEEP. TAKE IMMEDIATELY BEFORE BEDTIME. **REQUESTS 1 MG TABS** 180 tablet 1  . eszopiclone (LUNESTA) 2 MG TABS tablet Take 1 tablet (2 mg total) by mouth at bedtime as needed for sleep. Take immediately before bedtime 90  tablet 1  . FLUoxetine (PROZAC) 40 MG capsule TAKE 1 CAPSULE BY MOUTH DAILY. 90 capsule 3  . folic acid (FOLVITE) 1 MG tablet Take 1 mg by mouth daily.   3  . methotrexate 250 MG/10ML injection Inject 60 mg into the skin once a week.   1  . Omega-3 Fatty Acids (SUPER OMEGA-3) 1000 MG CAPS Take 1 capsule by mouth 2 (two) times daily.    . Probiotic Product (PROBIOTIC-10 PO) Take 1 tablet by mouth daily.    . TURMERIC PO Take 120 mg by mouth 2 (two) times daily.     No current facility-administered medications for this visit.      REVIEW OF SYSTEMS:   Constitutional: Denies fevers, chills or night sweats Eyes: Denies blurriness of vision Ears, nose, mouth, throat, and face: Denies mucositis or sore throat Respiratory: Denies cough, dyspnea or wheezes Cardiovascular: Denies palpitation, chest discomfort or lower extremity swelling Gastrointestinal:  Denies nausea, heartburn or change in bowel habits Skin: Denies abnormal skin rashes Lymphatics: Denies new lymphadenopathy or easy bruising Neurological:Denies numbness, tingling or new weaknesses Behavioral/Psych: Mood is stable, no new changes  All other systems were reviewed with the patient and are negative.  PHYSICAL EXAMINATION: ECOG PERFORMANCE STATUS: 0 - Asymptomatic  Vitals:   05/02/16 1338  BP: 133/69  Pulse: 73  Resp: 18  Temp: 98.3 F (36.8 C)   Filed Weights   05/02/16 1338  Weight: 163 lb 4.8 oz (74.1 kg)    GENERAL:alert, no distress and comfortable SKIN: skin color, texture, turgor are normal, no rashes or significant lesions Musculoskeletal:no cyanosis of digits and no clubbing  NEURO: alert & oriented x 3 with  fluent speech, no focal motor/sensory deficits  LABORATORY DATA:  I have reviewed the data as listed     Component Value Date/Time   NA 140 04/23/2016 1143   K 4.7 04/23/2016 1143   CL 103 12/03/2015 1019   CO2 28 04/23/2016 1143   GLUCOSE 96 04/23/2016 1143   BUN 10.4 04/23/2016 1143    CREATININE 0.7 04/23/2016 1143   CALCIUM 10.0 04/23/2016 1143   PROT 7.5 04/23/2016 1143   PROT 7.0 04/23/2016 1143   ALBUMIN 3.8 04/23/2016 1143   AST 24 04/23/2016 1143   ALT 23 04/23/2016 1143   ALKPHOS 95 04/23/2016 1143   BILITOT 0.49 04/23/2016 1143    No results found for: SPEP, UPEP  Lab Results  Component Value Date   WBC 6.0 04/23/2016   NEUTROABS 3.3 04/23/2016   HGB 13.7 04/23/2016   HCT 41.0 04/23/2016   MCV 89.3 04/23/2016   PLT 352 04/23/2016      Chemistry      Component Value Date/Time   NA 140 04/23/2016 1143   K 4.7 04/23/2016 1143   CL 103 12/03/2015 1019   CO2 28 04/23/2016 1143   BUN 10.4 04/23/2016 1143   CREATININE 0.7 04/23/2016 1143   GLU 92 06/07/2015      Component Value Date/Time   CALCIUM 10.0 04/23/2016 1143   ALKPHOS 95 04/23/2016 1143   AST 24 04/23/2016 1143   ALT 23 04/23/2016 1143   BILITOT 0.49 04/23/2016 1143      ASSESSMENT & PLAN:  Abnormal SPEP She was originally referred here because of abnormal SPEP. I have repeated her blood work with repeat myeloma panel including serum free light chain, immunofixation and 24-hour urine collection for free light chain. All her test results came back within normal limits. The chances of [place] missing a diagnosis of MGUS/multiple myeloma is less than 1% I suspect she may be false positive with her recent blood work. She does not need to return here for future follow-up   All questions were answered. The patient knows to call the clinic with any problems, questions or concerns. No barriers to learning was detected.  I spent 10 minutes counseling the patient face to face. The total time spent in the appointment was 15 minutes and more than 50% was on counseling.     Heath Lark, MD 10/13/20171:49 PM

## 2016-05-05 ENCOUNTER — Ambulatory Visit: Payer: BLUE CROSS/BLUE SHIELD | Admitting: Hematology and Oncology

## 2016-05-05 DIAGNOSIS — F4320 Adjustment disorder, unspecified: Secondary | ICD-10-CM | POA: Diagnosis not present

## 2016-05-05 NOTE — Telephone Encounter (Signed)
rx faxed to [facility].  

## 2016-05-06 ENCOUNTER — Ambulatory Visit: Payer: BLUE CROSS/BLUE SHIELD | Admitting: Dietician

## 2016-06-03 DIAGNOSIS — F4320 Adjustment disorder, unspecified: Secondary | ICD-10-CM | POA: Diagnosis not present

## 2016-06-04 DIAGNOSIS — M0570 Rheumatoid arthritis with rheumatoid factor of unspecified site without organ or systems involvement: Secondary | ICD-10-CM | POA: Diagnosis not present

## 2016-06-04 DIAGNOSIS — E119 Type 2 diabetes mellitus without complications: Secondary | ICD-10-CM | POA: Diagnosis not present

## 2016-06-04 DIAGNOSIS — H2513 Age-related nuclear cataract, bilateral: Secondary | ICD-10-CM | POA: Diagnosis not present

## 2016-06-11 DIAGNOSIS — F4320 Adjustment disorder, unspecified: Secondary | ICD-10-CM | POA: Diagnosis not present

## 2016-06-17 DIAGNOSIS — M19042 Primary osteoarthritis, left hand: Secondary | ICD-10-CM | POA: Diagnosis not present

## 2016-06-17 DIAGNOSIS — Z79899 Other long term (current) drug therapy: Secondary | ICD-10-CM | POA: Diagnosis not present

## 2016-06-17 DIAGNOSIS — M19041 Primary osteoarthritis, right hand: Secondary | ICD-10-CM | POA: Diagnosis not present

## 2016-06-17 DIAGNOSIS — M859 Disorder of bone density and structure, unspecified: Secondary | ICD-10-CM | POA: Diagnosis not present

## 2016-06-17 DIAGNOSIS — Z23 Encounter for immunization: Secondary | ICD-10-CM | POA: Diagnosis not present

## 2016-06-17 DIAGNOSIS — M0579 Rheumatoid arthritis with rheumatoid factor of multiple sites without organ or systems involvement: Secondary | ICD-10-CM | POA: Diagnosis not present

## 2016-06-17 DIAGNOSIS — F4320 Adjustment disorder, unspecified: Secondary | ICD-10-CM | POA: Diagnosis not present

## 2016-06-17 DIAGNOSIS — M79643 Pain in unspecified hand: Secondary | ICD-10-CM | POA: Diagnosis not present

## 2016-06-17 DIAGNOSIS — M8580 Other specified disorders of bone density and structure, unspecified site: Secondary | ICD-10-CM | POA: Diagnosis not present

## 2016-06-20 DIAGNOSIS — F4322 Adjustment disorder with anxiety: Secondary | ICD-10-CM | POA: Diagnosis not present

## 2016-06-24 DIAGNOSIS — F4320 Adjustment disorder, unspecified: Secondary | ICD-10-CM | POA: Diagnosis not present

## 2016-07-04 DIAGNOSIS — F4322 Adjustment disorder with anxiety: Secondary | ICD-10-CM | POA: Diagnosis not present

## 2016-07-07 DIAGNOSIS — F4320 Adjustment disorder, unspecified: Secondary | ICD-10-CM | POA: Diagnosis not present

## 2016-07-23 DIAGNOSIS — F4320 Adjustment disorder, unspecified: Secondary | ICD-10-CM | POA: Diagnosis not present

## 2016-07-29 DIAGNOSIS — F4320 Adjustment disorder, unspecified: Secondary | ICD-10-CM | POA: Diagnosis not present

## 2016-08-12 DIAGNOSIS — F4320 Adjustment disorder, unspecified: Secondary | ICD-10-CM | POA: Diagnosis not present

## 2016-08-18 DIAGNOSIS — F331 Major depressive disorder, recurrent, moderate: Secondary | ICD-10-CM | POA: Diagnosis not present

## 2016-08-21 DIAGNOSIS — G4721 Circadian rhythm sleep disorder, delayed sleep phase type: Secondary | ICD-10-CM | POA: Diagnosis not present

## 2016-08-21 DIAGNOSIS — J301 Allergic rhinitis due to pollen: Secondary | ICD-10-CM | POA: Diagnosis not present

## 2016-08-21 DIAGNOSIS — G4700 Insomnia, unspecified: Secondary | ICD-10-CM | POA: Diagnosis not present

## 2016-09-01 DIAGNOSIS — F331 Major depressive disorder, recurrent, moderate: Secondary | ICD-10-CM | POA: Diagnosis not present

## 2016-09-04 ENCOUNTER — Encounter: Payer: Self-pay | Admitting: Family Medicine

## 2016-09-05 IMAGING — MR MR ABDOMEN WO/W CM
17 series · 48 of 48 positions shown · IV contrast (15ml multihance)
Comparison: Abdominal ultrasound dated 07/26/2015

CLINICAL DATA: Evaluate right renal lesion on ultrasound

EXAM:
MRI ABDOMEN WITHOUT AND WITH CONTRAST
TECHNIQUE: Multiplanar multisequence MR imaging of the abdomen was performed
both before and after the administration of intravenous contrast.
CONTRAST:  15 mL Multihance IV

[Series 3: T2 · coronal · 5.0mm · 0.78mm/px · 2 of 16 slices shown (1 of 3)]
[im 1/16]
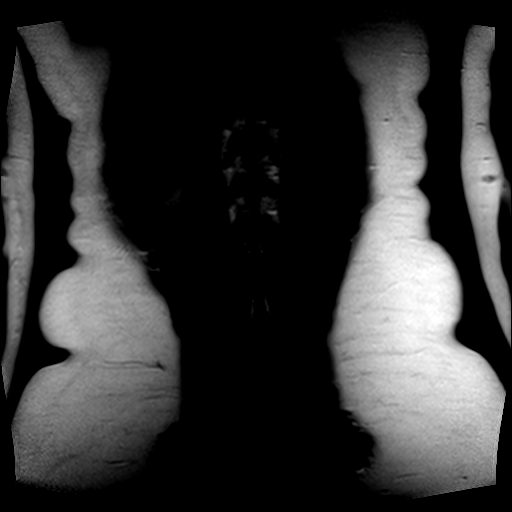
[im 16/16]
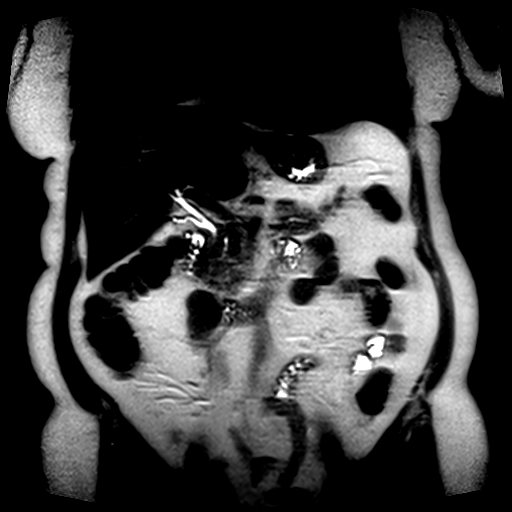

[Series 4: T2 · axial · 5.0mm · 0.66mm/px · z∈[-18,+114]mm · 2 of 23 slices shown (2 of 3)]
[im 1/23]
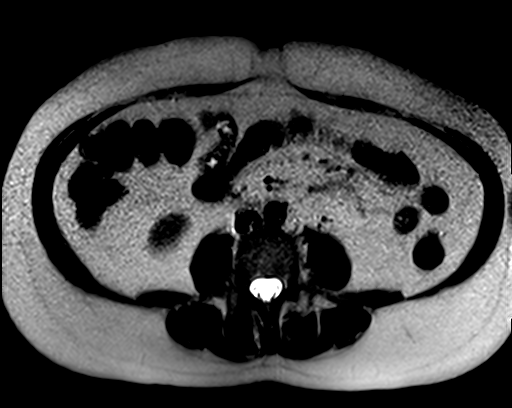
[im 23/23]
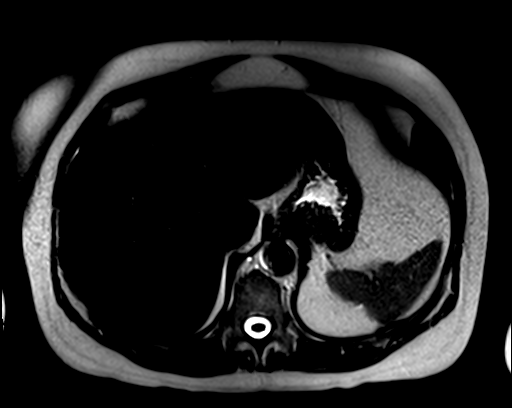

[Series 5: ep2d_diff_b50_500_800_p2_trig · axial · 6.0mm · 1.98mm/px · z∈[-25,+131]mm · 5 of 63 slices shown]
[im 1/63]
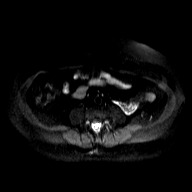
[im 16/63]
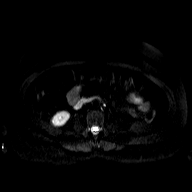
[im 32/63]
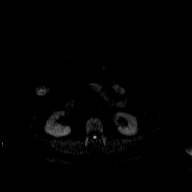
[im 47/63]
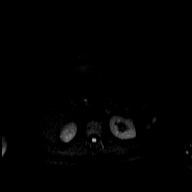
[im 63/63]
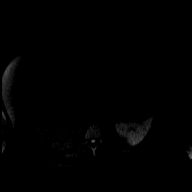

[Series 6: ep2d_diff_b50_500_800_p2_trig_adc · axial · 6.0mm · 1.98mm/px · z∈[-25,+131]mm · 2 of 21 slices shown]
[im 1/21]
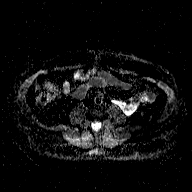
[im 21/21]
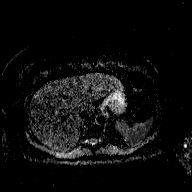

[Series 7: T2 · axial · 5.0mm · 1.33mm/px · z∈[-19,+125]mm · 2 of 25 slices shown (3 of 3)]
[im 1/25]
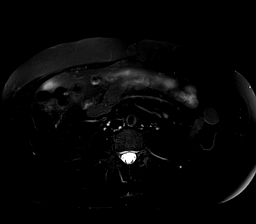
[im 25/25]
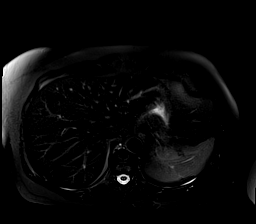

[Series 8: bSSFP · axial · 4.0mm · 0.74mm/px · z∈[-26,+122]mm · 2 of 38 slices shown]
[im 1/38]
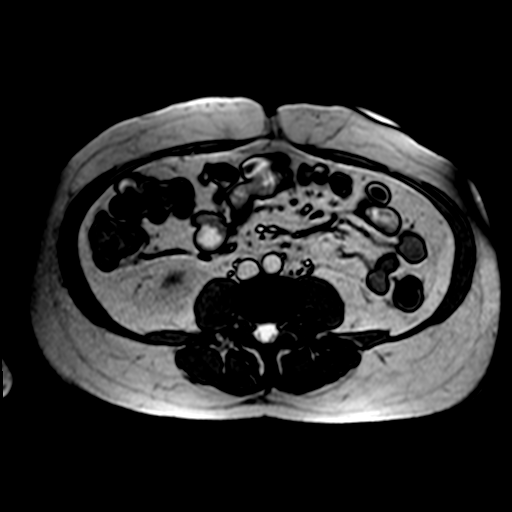
[im 38/38]
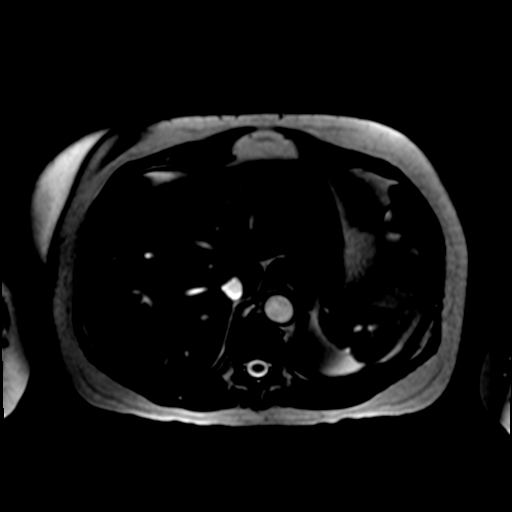

[Series 9: T1 · axial · 5.0mm · 0.66mm/px · z∈[-21,+117]mm · 3 of 48 slices shown]
[im 1/48]
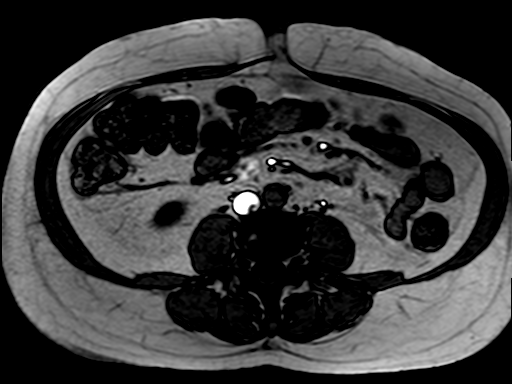
[im 24/48]
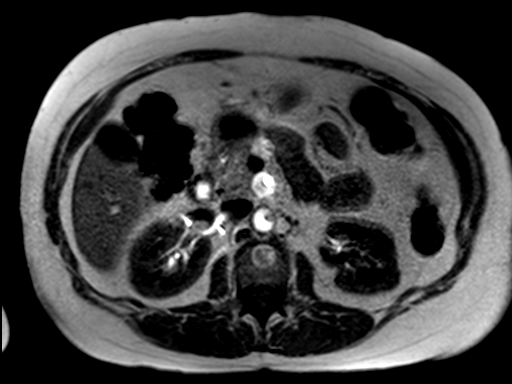
[im 48/48]
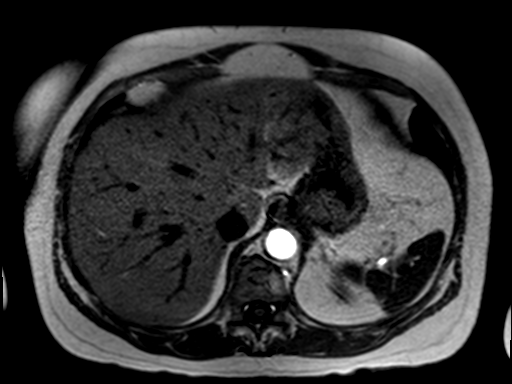

[Series 10: T1 dynamic · axial · non-contrast · 2.3mm · 1.41mm/px · z∈[-20,+116]mm · 3 of 60 slices shown]
[im 1/60]
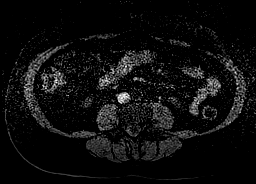
[im 30/60]
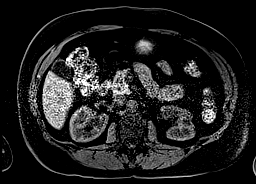
[im 60/60]
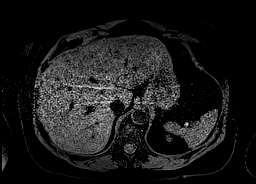

[Series 11: post 25 sec · axial · 2.3mm · 1.41mm/px · z∈[-20,+116]mm · 3 of 60 slices shown]
[im 1/60]
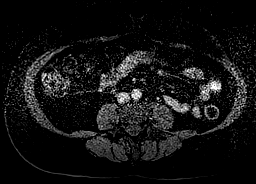
[im 30/60]
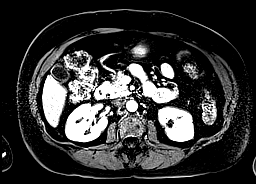
[im 60/60]
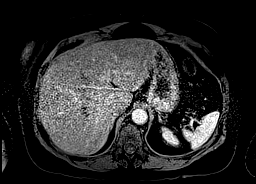

[Series 12: post 25 sec_sub · axial · 2.3mm · 1.41mm/px · z∈[-20,+116]mm · 3 of 60 slices shown]
[im 1/60]
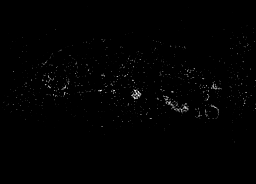
[im 30/60]
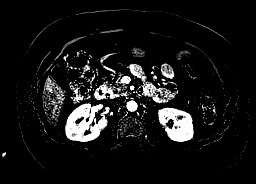
[im 60/60]
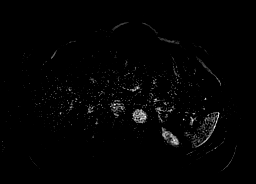

[Series 13: post 45 sec · axial · 2.3mm · 1.41mm/px · z∈[-20,+116]mm · 3 of 60 slices shown]
[im 1/60]
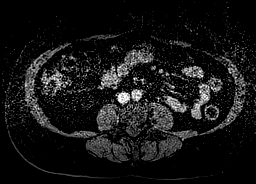
[im 30/60]
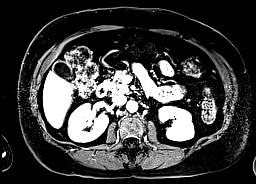
[im 60/60]
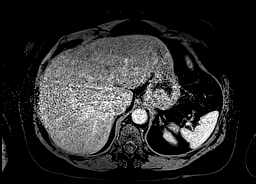

[Series 14: post 45 sec_sub · axial · 2.3mm · 1.41mm/px · z∈[-20,+116]mm · 3 of 60 slices shown]
[im 1/60]
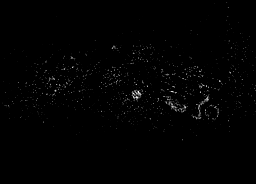
[im 30/60]
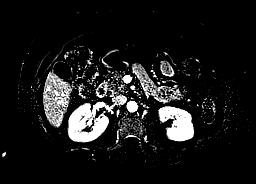
[im 60/60]
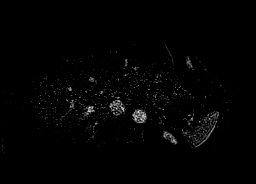

[Series 15: post 90 sec · axial · 2.3mm · 1.41mm/px · z∈[-20,+116]mm · 3 of 60 slices shown]
[im 1/60]
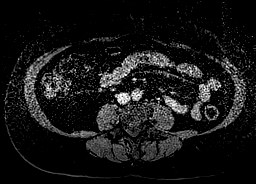
[im 30/60]
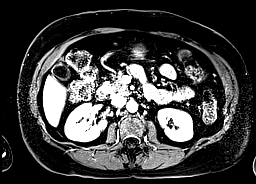
[im 60/60]
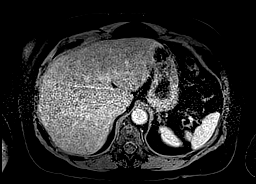

[Series 16: post 90 sec_sub · axial · 2.3mm · 1.41mm/px · z∈[-20,+116]mm · 3 of 60 slices shown]
[im 1/60]
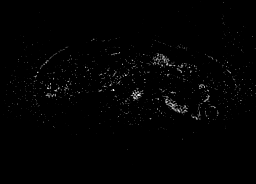
[im 30/60]
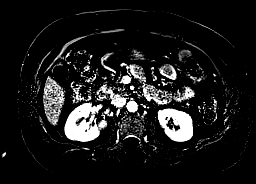
[im 60/60]
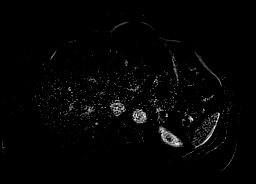

[Series 17: T1 dynamic post-contrast · coronal · 2.6mm · 0.78mm/px · 3 of 52 slices shown]
[im 1/52]
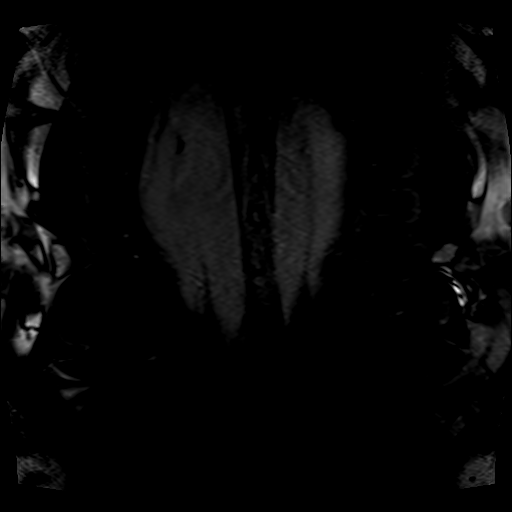
[im 26/52]
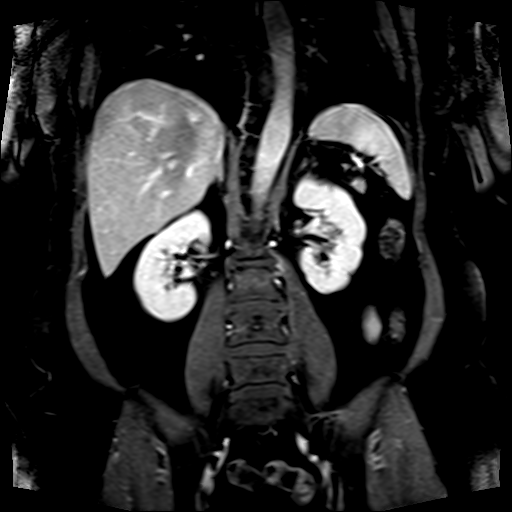
[im 52/52]
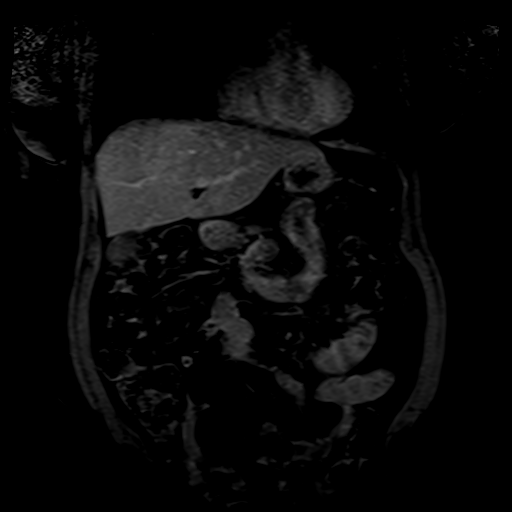

[Series 18: post axial 3+ · axial · 2.3mm · 1.41mm/px · z∈[-20,+116]mm · 3 of 60 slices shown (1 of 2)]
[im 1/60]
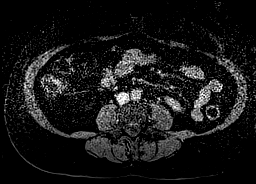
[im 30/60]
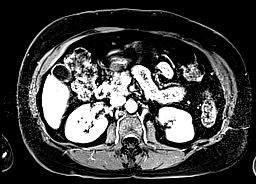
[im 60/60]
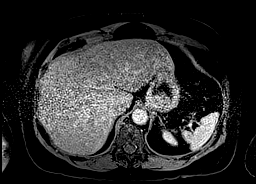

[Series 19: post axial 3+ · axial · 2.3mm · 1.41mm/px · z∈[-20,+116]mm · 3 of 60 slices shown (2 of 2)]
[im 1/60]
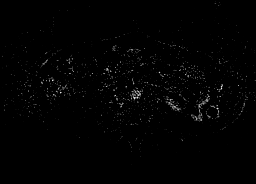
[im 30/60]
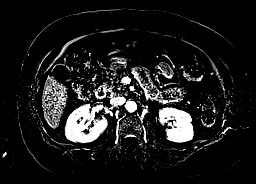
[im 60/60]
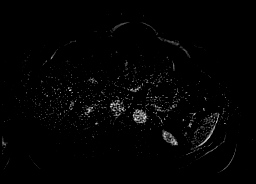

[48 of 48 positions shown; findings below may reference images not displayed]

FINDINGS: Lower chest:  Lung bases are clear.

Hepatobiliary: Liver is within normal limits. No
suspicious/enhancing hepatic lesions. No hepatic steatosis.

Gallbladder is within normal limits. No intrahepatic or extrahepatic
ductal dilatation.

Pancreas: Within normal limits.

Spleen: Within normal limits.

Adrenals/Urinary Tract: Adrenal glands are within normal limits.

Kidneys are within normal limits. Specifically, no evidence of right
renal mass or differential enhancement. No hydronephrosis.

Stomach/Bowel: Stomach is within normal limits.

Visualized bowel is unremarkable.

Vascular/Lymphatic: No evidence of abdominal aortic aneurysm.

No suspicious abdominal lymphadenopathy.

Other: No abdominal ascites.

Musculoskeletal: No focal osseous lesions.
IMPRESSION: Normal right kidney. No evidence of right renal mass to correspond
to the suspected sonographic abnormality.

Normal MRI of the abdomen.

## 2016-09-08 ENCOUNTER — Other Ambulatory Visit: Payer: Self-pay | Admitting: Internal Medicine

## 2016-09-08 ENCOUNTER — Telehealth: Payer: Self-pay

## 2016-09-08 NOTE — Telephone Encounter (Signed)
Received a refill request for Atorvastatin. This was last prescribed by Michele Calico, MD. Please advise.   Lipid Panel 12/03/2015    Component Value Date/Time   CHOL 171 12/03/2015 1019   TRIG 110.0 12/03/2015 1019   HDL 49.20 12/03/2015 1019   CHOLHDL 3 12/03/2015 1019   VLDL 22.0 12/03/2015 1019   LDLCALC 100 (H) 12/03/2015 1019   LDLDIRECT 171.5 03/14/2013 0945   CMP Latest Ref Rng & Units 04/23/2016 04/23/2016 12/03/2015  Glucose 70 - 140 mg/dl 96 - 108(H)  BUN 7.0 - 26.0 mg/dL 10.4 - 10  Creatinine 0.6 - 1.1 mg/dL 0.7 - 0.59  Sodium 136 - 145 mEq/L 140 - 139  Potassium 3.5 - 5.1 mEq/L 4.7 - 4.3  Chloride 96 - 112 mEq/L - - 103  CO2 22 - 29 mEq/L 28 - 32  Calcium 8.4 - 10.4 mg/dL 10.0 - 9.6  Total Protein 6.0 - 8.5 g/dL 7.5 7.0 6.8  Total Bilirubin 0.20 - 1.20 mg/dL 0.49 - 0.5  Alkaline Phos 40 - 150 U/L 95 - 67  AST 5 - 34 U/L 24 - 27  ALT 0 - 55 U/L 23 - 32

## 2016-09-10 MED ORDER — ATORVASTATIN CALCIUM 20 MG PO TABS: 20.0000 mg | ORAL_TABLET | Freq: Every day | ORAL | 0 refills | Status: DC

## 2016-09-10 NOTE — Telephone Encounter (Signed)
Afternoon Michele Adkins, Her LFT from 11/2015 were WNL, however last Atorvastatin Rx was sent in 2016. Can you please call and ask when she last took Atorvastatin?  Also please ask about her alcohol use. Thanks! [name]

## 2016-09-10 NOTE — Telephone Encounter (Signed)
Please refill Lipitor. Thanks! Michele Adkins

## 2016-09-10 NOTE — Telephone Encounter (Signed)
Patient reports she may a 1 to 2 drinks a month. She is taking the Lipitor daily.

## 2016-09-10 NOTE — Telephone Encounter (Signed)
Medication sent. Patient is aware.  

## 2016-09-15 DIAGNOSIS — F331 Major depressive disorder, recurrent, moderate: Secondary | ICD-10-CM | POA: Diagnosis not present

## 2016-09-15 DIAGNOSIS — M0579 Rheumatoid arthritis with rheumatoid factor of multiple sites without organ or systems involvement: Secondary | ICD-10-CM | POA: Diagnosis not present

## 2016-09-15 LAB — CBC AND DIFFERENTIAL
HCT: 41 (ref 36–46)
HCT: 41 (ref 36–46)
Hemoglobin: 13.7 (ref 12.0–16.0)
Hemoglobin: 13.7 (ref 12.0–16.0)
Platelets: 390 (ref 150–399)
Platelets: 390 (ref 150–399)
WBC: 5.4
WBC: 5.4

## 2016-09-15 LAB — HEMOGLOBIN A1C
Hemoglobin A1C: 6
Hemoglobin A1C: 6

## 2016-09-15 LAB — BASIC METABOLIC PANEL
BUN: 12 (ref 4–21)
BUN: 12 (ref 4–21)
Creatinine: 0.6 (ref 0.5–1.1)
Creatinine: 0.6 (ref 0.5–1.1)
Glucose: 84
Glucose: 84
Potassium: 5 (ref 3.4–5.3)
Potassium: 5 (ref 3.4–5.3)
Sodium: 140 (ref 137–147)
Sodium: 140 (ref 137–147)

## 2016-09-15 LAB — HEPATIC FUNCTION PANEL
ALT: 51 — AB (ref 7–35)
ALT: 51 — AB (ref 7–35)
AST: 35 (ref 13–35)
AST: 35 (ref 13–35)
Alkaline Phosphatase: 85 (ref 25–125)
Alkaline Phosphatase: 85 (ref 25–125)
Bilirubin, Total: 0.6
Bilirubin, Total: 0.6

## 2016-09-15 LAB — POCT ERYTHROCYTE SEDIMENTATION RATE, NON-AUTOMATED: Sed Rate: 15

## 2016-11-10 DIAGNOSIS — F331 Major depressive disorder, recurrent, moderate: Secondary | ICD-10-CM | POA: Diagnosis not present

## 2016-11-11 ENCOUNTER — Encounter: Payer: Self-pay | Admitting: Internal Medicine

## 2016-11-11 ENCOUNTER — Encounter: Payer: Self-pay | Admitting: Physician Assistant

## 2016-11-11 ENCOUNTER — Other Ambulatory Visit (INDEPENDENT_AMBULATORY_CARE_PROVIDER_SITE_OTHER): Payer: BLUE CROSS/BLUE SHIELD

## 2016-11-11 ENCOUNTER — Ambulatory Visit (INDEPENDENT_AMBULATORY_CARE_PROVIDER_SITE_OTHER): Payer: BLUE CROSS/BLUE SHIELD | Admitting: Physician Assistant

## 2016-11-11 VITALS — BP 128/72 | HR 70 | Ht 66.0 in | Wt 163.0 lb

## 2016-11-11 DIAGNOSIS — Z86010 Personal history of colonic polyps: Secondary | ICD-10-CM | POA: Diagnosis not present

## 2016-11-11 DIAGNOSIS — R109 Unspecified abdominal pain: Secondary | ICD-10-CM

## 2016-11-11 DIAGNOSIS — K625 Hemorrhage of anus and rectum: Secondary | ICD-10-CM

## 2016-11-11 DIAGNOSIS — K5900 Constipation, unspecified: Secondary | ICD-10-CM | POA: Diagnosis not present

## 2016-11-11 DIAGNOSIS — R194 Change in bowel habit: Secondary | ICD-10-CM

## 2016-11-11 LAB — TSH: TSH: 2.74 u[IU]/mL (ref 0.35–4.50)

## 2016-11-11 LAB — C-REACTIVE PROTEIN: CRP: 0.4 mg/dL — ABNORMAL LOW (ref 0.5–20.0)

## 2016-11-11 LAB — IGA: IgA: 161 mg/dL (ref 68–378)

## 2016-11-11 MED ORDER — NA SULFATE-K SULFATE-MG SULF 17.5-3.13-1.6 GM/177ML PO SOLN: 1.0000 | Freq: Once | ORAL | 0 refills | Status: DC

## 2016-11-11 NOTE — Patient Instructions (Signed)
Your physician has requested that you go to the basement for lab work before leaving today.  Continue your probiotic daily.   You have been scheduled for a colonoscopy. Please follow written instructions given to you at your visit today.  Please pick up your prep supplies at the pharmacy within the next 1-3 days. If you use inhalers (even only as needed), please bring them with you on the day of your procedure. Your physician has requested that you go to www.startemmi.com and enter the access code given to you at your visit today. This web site gives a general overview about your procedure. However, you should still follow specific instructions given to you by our office regarding your preparation for the procedure.

## 2016-11-11 NOTE — Progress Notes (Addendum)
Subjective:    Patient ID: Michele Adkins, female    DOB: 1955-12-15, 61 y.o.   MRN: [id number]  HPI Michele Adkins is a pleasant 61 year old white female known to Dr. Carlean Purl from prior colonoscopy who comes in today with complaints of change in bowel habits with fairly severe constipation, lower abdominal pain and intermittent rectal bleeding. She had had colonoscopy done in 2013 which was a normal exam with the exception of small internal hemorrhoids and was recommended for 10 year interval follow-up. She also had colonoscopy done in 2008 in [place] with finding of one 2 mm cecal polyp which was apparently inflammatory. Patient also has adult-onset diabetes mellitus, rheumatoid arthritis for which she is on methotrexate, and anxiety. Exline As she's had her current symptoms for the past couple of years, but feels that things have progressed over the past several months. She has made a couple of trips to [place] but says that she has stayed in the city and always drinks bottled water etc. He says she just returned from there about 3 weeks ago and did not have a bowel movement on her own ever since. She was constipated prior to leaving for this trip as well. She is generally going 5-6 days between bowel movements and says she gets very uncomfortable and has abdominal pain if she doesn't go more regularly. She's had a lot of gas and bloating and has no urge for bowel movements generally. When she does have stool she says that the stools are semi-formed sometimes "creamy" and she has noticed some bright red blood intermittently on the tissue and also at times streaks of blood in the commode. She has had some tenesmus type discomfort with rectal pressure and has noticed a significant increase in mucus with her bowel movements as well. She describes a burning cramping abdominal pain and has intermittent episodes of this pain and cramping followed by complete evacuation of her bowel with diarrhea. Appetite has been  fine and weight has been stable. No recent antibiotics, no recent significant changes in medications. She has tried Metamucil which is somewhat helpful, Senokot does help with constipation and did not find MiraLAX helpful. She is very concerned that she has developed some sort of colitis. Exline She did have several stool studies done including GI pathogen panel, stool for O&P etc. in 2015 no stool studies since.  Review of Systems Pertinent positive and negative review of systems were noted in the above HPI section.  All other review of systems was otherwise negative.  Outpatient Encounter Prescriptions as of 11/11/2016  Medication Sig  . aspirin 81 MG tablet Take 81 mg by mouth daily.  Marland Kitchen atorvastatin (LIPITOR) 20 MG tablet Take 1 tablet (20 mg total) by mouth daily.  Marland Kitchen conjugated estrogens (PREMARIN) vaginal cream Place 1 Applicatorful vaginally daily. (Patient taking differently: Place 1 Applicatorful vaginally as needed. )  . eszopiclone (LUNESTA) 1 MG TABS tablet TAKE 2 TABLETS BY MOUTH AT BEDTIME AS NEEDED FOR SLEEP. TAKE IMMEDIATELY BEFORE BEDTIME. **REQUESTS 1 MG TABS**  . eszopiclone (LUNESTA) 2 MG TABS tablet Take 1 tablet (2 mg total) by mouth at bedtime as needed for sleep. Take immediately before bedtime (Patient taking differently: Take 3 mg by mouth at bedtime as needed for sleep. Take immediately before bedtime)  . FLUoxetine (PROZAC) 40 MG capsule TAKE 1 CAPSULE BY MOUTH DAILY.  . folic acid (FOLVITE) 1 MG tablet Take 1 mg by mouth daily.   . methotrexate 250 MG/10ML injection Inject 60 mg into the  skin once a week.   . Omega-3 Fatty Acids (SUPER OMEGA-3) 1000 MG CAPS Take 1 capsule by mouth 2 (two) times daily.  . Probiotic Product (PROBIOTIC-10 PO) Take 1 tablet by mouth daily.  . TURMERIC PO Take 120 mg by mouth 2 (two) times daily.  . [DISCONTINUED] Na Sulfate-K Sulfate-Mg Sulf 17.5-3.13-1.6 GM/180ML SOLN Take 1 kit by mouth once.   No facility-administered encounter  medications on file as of 11/11/2016.    Allergies  Allergen Reactions  . Sulfa Drugs Cross Reactors Hives   Patient Active Problem List   Diagnosis Date Noted  . Abnormal SPEP 05/02/2016  . 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 Elevated blood pressure 04/17/2016  . 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 Renal mass, right 07/28/2015  . Elevated LFTs 07/11/2015  . Allergic rhinitis due to pollen 05/29/2014  . 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 Menopausal vaginal dryness 01/26/2014  . Depression with anxiety 03/11/2013  . Routine general medical examination at a health care facility 03/01/2012  . Diabetes mellitus type 2 in nonobese ([place]) 03/01/2012  . Hyperlipidemia with target LDL less than 100 03/01/2012  . Osteopenia 03/01/2012  . Rheumatoid arthritis ([place]) 03/01/2012  . Personal history of colonic polyps- per patient recall approx 2007 03/01/2012   Social History   Social History  . Marital status: Married    Spouse name: Training and development officer  . Number of children: N/A  . Years of education: N/A   Occupational History  . Not on file.   Social History Main Topics  . Smoking status: Never Smoker  . Smokeless tobacco: Never Used  . Alcohol use No  . Drug use: No  . Sexual activity: Yes    Partners: Male   Other Topics Concern  . Not on file   Social History Narrative  . No narrative on file    Ms. Spira family history includes Cancer in her sister; Colon cancer (age of onset: 35) in her maternal grandfather; Diabetes in her brother, mother, and sister; Hypertension in her father.      Objective:    Vitals:   11/11/16 0934  BP: 128/72  Pulse: 70    Physical Exam  well-developed older white female in no acute distress, blood pressure 128/72 pulse 70, BMI 26.3. HEENT;nontraumatic normocephalic EOMI PERRLA sclerae anicteric, Cardiovascular; regular rate and rhythm with S1-S2 no murmur rub or gallop, Pulmonary ;clear  bilaterally, Abdomen; soft, she has some mild generalized tenderness no guarding or rebound no palpable mass or hepatosplenomegaly bowel sounds are present, Rectal ;exam not done, Extremities ;no clubbing cyanosis or edema skin warm and dry, Neuropsych; mood and affect appropriate       Assessment & Plan:   #42 61 year old white female with several month history of change in bowel habits with progressive constipation, crampy abdominal pain, bloating, gas, as well as significant increase in mucus in stool and intermittent streaks of  bright red blood. Etiology is not clear, her symptoms may be functional, though she has not had IBS type symptoms in the past. Rule out occult colon lesion, rule out IBD, rule out giardiasis  #2 intermittent rectal bleeding-previously documented internal hemorrhoids may be source, but also need to rule out other inflammatory process #3 rheumatoid arthritis #4 of adult-onset diabetes mellitus #5 anxiety  Plan; patient will be scheduled for colonoscopy with Dr. Carlean Purl. Procedure discussed in detail including risks and benefits and she is agreeable to proceed. Check stool for O&P, check TSH, CRP, sedimentation  rate and celiac markers Offered an antispasmodic which she declines at present. She will continue to use Metamucil and/or Senokot on an as-needed basis for constipation.   Makalyn Lennox S Cloud Graham PA-C 11/11/2016   Cc: Mellody Dance, DO   Agree with Ms. Genia Harold assessment and plan. W/u including colonoscopy was negative . Gatha Mayer, MD, [facility]

## 2016-11-12 ENCOUNTER — Other Ambulatory Visit: Payer: BLUE CROSS/BLUE SHIELD

## 2016-11-12 DIAGNOSIS — R194 Change in bowel habit: Secondary | ICD-10-CM

## 2016-11-12 DIAGNOSIS — K625 Hemorrhage of anus and rectum: Secondary | ICD-10-CM | POA: Diagnosis not present

## 2016-11-12 DIAGNOSIS — R109 Unspecified abdominal pain: Secondary | ICD-10-CM

## 2016-11-12 DIAGNOSIS — K5900 Constipation, unspecified: Secondary | ICD-10-CM | POA: Diagnosis not present

## 2016-11-12 LAB — TISSUE TRANSGLUTAMINASE, IGA: Tissue Transglutaminase Ab, IgA: 1 U/mL (ref <4)

## 2016-11-13 LAB — OVA AND PARASITE EXAMINATION: OP: NONE SEEN

## 2016-11-18 DIAGNOSIS — F331 Major depressive disorder, recurrent, moderate: Secondary | ICD-10-CM | POA: Diagnosis not present

## 2016-11-24 ENCOUNTER — Ambulatory Visit (AMBULATORY_SURGERY_CENTER): Payer: BLUE CROSS/BLUE SHIELD | Admitting: Internal Medicine

## 2016-11-24 ENCOUNTER — Encounter: Payer: Self-pay | Admitting: Internal Medicine

## 2016-11-24 VITALS — BP 104/54 | HR 52 | Temp 95.5°F | Resp 15 | Ht 66.0 in | Wt 163.0 lb

## 2016-11-24 DIAGNOSIS — R194 Change in bowel habit: Secondary | ICD-10-CM

## 2016-11-24 MED ORDER — SODIUM CHLORIDE 0.9 % IV SOLN: 500.0000 mL | INTRAVENOUS | Status: DC

## 2016-11-24 MED ORDER — BENEFIBER PO POWD: ORAL | 0 refills | Status: DC

## 2016-11-24 NOTE — Progress Notes (Signed)
Report given to PACU, vss 

## 2016-11-24 NOTE — Progress Notes (Signed)
Pt. Reports no change in surgical or medical history since pre-visit 11/11/2016.

## 2016-11-24 NOTE — Op Note (Signed)
[facility] Patient Name: Michele Adkins Procedure Date: 11/24/2016 2:12 PM MRN: [id number] Endoscopist: Gatha Mayer , MD Age: 61 Referring MD:  Date of Birth: 13-Feb-1956 Gender: Female Account #: [id number] Procedure:                Colonoscopy Indications:              Change in bowel habits Medicines:                Propofol per Anesthesia, Monitored Anesthesia Care Procedure:                Pre-Anesthesia Assessment:                           - Prior to the procedure, a History and Physical                            was performed, and patient medications and                            allergies were reviewed. The patient's tolerance of                            previous anesthesia was also reviewed. The risks                            and benefits of the procedure and the sedation                            options and risks were discussed with the patient.                            All questions were answered, and informed consent                            was obtained. Prior Anticoagulants: The patient has                            taken no previous anticoagulant or antiplatelet                            agents. ASA Grade Assessment: II - A patient with                            mild systemic disease. After reviewing the risks                            and benefits, the patient was deemed in                            satisfactory condition to undergo the procedure.                           After obtaining informed consent, the colonoscope  was passed under direct vision. Throughout the                            procedure, the patient's blood pressure, pulse, and                            oxygen saturations were monitored continuously. The                            Colonoscope was introduced through the anus and                            advanced to the the cecum, identified by                            appendiceal orifice and  ileocecal valve. The                            colonoscopy was performed without difficulty. The                            patient tolerated the procedure well. The quality                            of the bowel preparation was excellent. The bowel                            preparation used was Miralax. The ileocecal valve,                            appendiceal orifice, and rectum were photographed. Scope In: 2:28:31 PM Scope Out: 2:39:05 PM Scope Withdrawal Time: 0 hours 8 minutes 6 seconds  Total Procedure Duration: 0 hours 10 minutes 34 seconds  Findings:                 The perianal and digital rectal examinations were                            normal.                           The colon (entire examined portion) appeared normal.                           Internal hemorrhoids were found during                            retroflexion. The hemorrhoids were small. Complications:            No immediate complications. Estimated Blood Loss:     Estimated blood loss: none. Impression:               - The entire examined colon is normal.                           - Internal hemorrhoids.                           -  No specimens collected. Recommendation:           - Patient has a contact number available for                            emergencies. The signs and symptoms of potential                            delayed complications were discussed with the                            patient. Return to normal activities tomorrow.                            Written discharge instructions were provided to the                            patient.                           - Resume previous diet.                           - Continue present medications.                           - Repeat colonoscopy in 10 years for screening                            purposes.                           - Try benefiber 1 tablespoon a day - can increase                            to 3 a day.                            Also would take VSL # 3 450 mil count daily x 1                            month                           Cancel 2023 colonoscopy recall now 2028 Gatha Mayer, MD 11/24/2016 2:51:21 PM This report has been signed electronically.

## 2016-11-24 NOTE — Patient Instructions (Addendum)
I saw your hemorrhoids - all else ok.  I think this is an Irritable Bowel Syndrome problem.  I recommend you try a tablespoon of Benefiber up to 3 in a day to see if that makes a difference.  We can try other things if you want also - I think its mainly up to you and how much the symptoms bother you and if its worth medicating.  I see that you have tried a probiotic - guess that was not helping but I often recommend that for about one month - one I like is called VSL #3 and cand be gotten at [facility] or be ordered by your pharmacy - you could take the 450 million colony count once a day x 1 month.  Next routine colonoscopy or other screening test in 10 years - 2028  I appreciate the opportunity to care for you.     YOU HAD AN ENDOSCOPIC PROCEDURE TODAY AT [facility]:   Refer to the procedure report that was given to you for any specific questions about what was found during the examination.  If the procedure report does not answer your questions, please call your gastroenterologist to clarify.  If you requested that your care partner not be given the details of your procedure findings, then the procedure report has been included in a sealed envelope for you to review at your convenience later.  YOU SHOULD EXPECT: Some feelings of bloating in the abdomen. Passage of more gas than usual.  Walking can help get rid of the air that was put into your GI tract during the procedure and reduce the bloating. If you had a lower endoscopy (such as a colonoscopy or flexible sigmoidoscopy) you may notice spotting of blood in your stool or on the toilet paper. If you underwent a bowel prep for your procedure, you may not have a normal bowel movement for a few days.  Please Note:  You might notice some irritation and congestion in your nose or some drainage.  This is from the oxygen used during your procedure.  There is no need for concern and it should clear up in a day or  so.  SYMPTOMS TO REPORT IMMEDIATELY:   Following lower endoscopy (colonoscopy or flexible sigmoidoscopy):  Excessive amounts of blood in the stool  Significant tenderness or worsening of abdominal pains  Swelling of the abdomen that is new, acute  Fever of 100F or higher   For urgent or emergent issues, a gastroenterologist can be reached at any hour by calling [phone].   DIET:  We do recommend a small meal at first, but then you may proceed to your regular diet.  Drink plenty of fluids but you should avoid alcoholic beverages for 24 hours.  ACTIVITY:  You should plan to take it easy for the rest of today and you should NOT DRIVE or use heavy machinery until tomorrow (because of the sedation medicines used during the test).    FOLLOW UP: Our staff will call the number listed on your records the next business day following your procedure to check on you and address any questions or concerns that you may have regarding the information given to you following your procedure. If we do not reach you, we will leave a message.  However, if you are feeling well and you are not experiencing any problems, there is no need to return our call.  We will assume that you have returned to your regular daily activities  without incident.  If any biopsies were taken you will be contacted by phone or by letter within the next 1-3 weeks.  Please call us at [phone] if you have not heard about the biopsies in 3 weeks.    SIGNATURES/CONFIDENTIALITY: You and/or your care partner have signed paperwork which will be entered into your electronic medical record.  These signatures attest to the fact that that the information above on your After Visit Summary has been reviewed and is understood.  Full responsibility of the confidentiality of this discharge information lies with you and/or your care-partner.   Resume medications.

## 2016-11-25 ENCOUNTER — Telehealth: Payer: Self-pay

## 2016-11-25 DIAGNOSIS — F331 Major depressive disorder, recurrent, moderate: Secondary | ICD-10-CM | POA: Diagnosis not present

## 2016-11-25 NOTE — Telephone Encounter (Signed)
  Follow up Call-  Call back number 11/24/2016  Post procedure Call Back phone  # [phone]  Permission to leave phone message Yes  Some recent data might be hidden     Patient questions:  Do you have a fever, pain , or abdominal swelling? No. Pain Score  0 *  Have you tolerated food without any problems? Yes.    Have you been able to return to your normal activities? Yes.    Do you have any questions about your discharge instructions: Diet   No. Medications  No. Follow up visit  No.  Do you have questions or concerns about your Care? No.  Actions: * If pain score is 4 or above: No action needed, pain <4.  No problems noted per pt. maw

## 2016-12-01 DIAGNOSIS — F331 Major depressive disorder, recurrent, moderate: Secondary | ICD-10-CM | POA: Diagnosis not present

## 2016-12-08 DIAGNOSIS — F331 Major depressive disorder, recurrent, moderate: Secondary | ICD-10-CM | POA: Diagnosis not present

## 2016-12-22 DIAGNOSIS — F331 Major depressive disorder, recurrent, moderate: Secondary | ICD-10-CM | POA: Diagnosis not present

## 2017-01-14 DIAGNOSIS — F429 Obsessive-compulsive disorder, unspecified: Secondary | ICD-10-CM | POA: Diagnosis not present

## 2017-01-14 DIAGNOSIS — F331 Major depressive disorder, recurrent, moderate: Secondary | ICD-10-CM | POA: Diagnosis not present

## 2017-01-14 DIAGNOSIS — F900 Attention-deficit hyperactivity disorder, predominantly inattentive type: Secondary | ICD-10-CM | POA: Diagnosis not present

## 2017-02-02 DIAGNOSIS — F331 Major depressive disorder, recurrent, moderate: Secondary | ICD-10-CM | POA: Diagnosis not present

## 2017-02-03 DIAGNOSIS — G4733 Obstructive sleep apnea (adult) (pediatric): Secondary | ICD-10-CM | POA: Diagnosis not present

## 2017-02-04 DIAGNOSIS — G4733 Obstructive sleep apnea (adult) (pediatric): Secondary | ICD-10-CM | POA: Diagnosis not present

## 2017-02-04 DIAGNOSIS — G4700 Insomnia, unspecified: Secondary | ICD-10-CM | POA: Diagnosis not present

## 2017-02-05 DIAGNOSIS — M0579 Rheumatoid arthritis with rheumatoid factor of multiple sites without organ or systems involvement: Secondary | ICD-10-CM | POA: Diagnosis not present

## 2017-02-05 DIAGNOSIS — E559 Vitamin D deficiency, unspecified: Secondary | ICD-10-CM | POA: Diagnosis not present

## 2017-02-05 DIAGNOSIS — Z79899 Other long term (current) drug therapy: Secondary | ICD-10-CM | POA: Diagnosis not present

## 2017-02-05 LAB — HEPATIC FUNCTION PANEL
ALT: 49 — AB (ref 7–35)
ALT: 49 — AB (ref 7–35)
AST: 26 (ref 13–35)
AST: 26 (ref 13–35)
Alkaline Phosphatase: 105 (ref 25–125)
Alkaline Phosphatase: 105 (ref 25–125)
Bilirubin, Total: 0.2
Bilirubin, Total: 0.2

## 2017-02-05 LAB — CBC AND DIFFERENTIAL
HCT: 40 (ref 36–46)
HCT: 40 (ref 36–46)
Hemoglobin: 13.4 (ref 12.0–16.0)
Hemoglobin: 13.4 (ref 12.0–16.0)
Platelets: 378 (ref 150–399)
Platelets: 378 (ref 150–399)
WBC: 7.1
WBC: 7.1

## 2017-02-05 LAB — BASIC METABOLIC PANEL
BUN: 101 — AB (ref 4–21)
BUN: 13 (ref 4–21)
Creatinine: 0.7 (ref 0.5–1.1)
Creatinine: 0.7 (ref 0.5–1.1)
Glucose: 101
Glucose: 101
Potassium: 5.1 (ref 3.4–5.3)
Potassium: 5.1 (ref 3.4–5.3)
Sodium: 139 (ref 137–147)
Sodium: 139 (ref 137–147)

## 2017-02-05 LAB — VITAMIN D 25 HYDROXY (VIT D DEFICIENCY, FRACTURES)
Vit D, 25-Hydroxy: 35
Vit D, 25-Hydroxy: 35

## 2017-02-05 LAB — POCT ERYTHROCYTE SEDIMENTATION RATE, NON-AUTOMATED: Sed Rate: 16

## 2017-02-09 DIAGNOSIS — F331 Major depressive disorder, recurrent, moderate: Secondary | ICD-10-CM | POA: Diagnosis not present

## 2017-02-16 DIAGNOSIS — Z79899 Other long term (current) drug therapy: Secondary | ICD-10-CM | POA: Diagnosis not present

## 2017-02-16 DIAGNOSIS — M8580 Other specified disorders of bone density and structure, unspecified site: Secondary | ICD-10-CM | POA: Diagnosis not present

## 2017-02-16 DIAGNOSIS — M0579 Rheumatoid arthritis with rheumatoid factor of multiple sites without organ or systems involvement: Secondary | ICD-10-CM | POA: Diagnosis not present

## 2017-02-16 DIAGNOSIS — M79643 Pain in unspecified hand: Secondary | ICD-10-CM | POA: Diagnosis not present

## 2017-02-23 DIAGNOSIS — F331 Major depressive disorder, recurrent, moderate: Secondary | ICD-10-CM | POA: Diagnosis not present

## 2017-03-02 DIAGNOSIS — F331 Major depressive disorder, recurrent, moderate: Secondary | ICD-10-CM | POA: Diagnosis not present

## 2017-03-09 DIAGNOSIS — F331 Major depressive disorder, recurrent, moderate: Secondary | ICD-10-CM | POA: Diagnosis not present

## 2017-03-11 ENCOUNTER — Other Ambulatory Visit: Payer: Self-pay | Admitting: Adult Health

## 2017-03-11 ENCOUNTER — Other Ambulatory Visit: Payer: Self-pay | Admitting: Internal Medicine

## 2017-03-11 ENCOUNTER — Telehealth: Payer: Self-pay | Admitting: Family Medicine

## 2017-03-11 DIAGNOSIS — F418 Other specified anxiety disorders: Secondary | ICD-10-CM

## 2017-03-11 NOTE — Telephone Encounter (Signed)
Pt cld states going out of the country for 2 months and needs Rx refill on Atorvastatin 20 MG tablets--  Pt says all her blood work has been kept up every 3 month by rheumatoid doctor/ Dr. Dossie Der at [facility]/[phone] (not a part of [facility] ) but we should be able to hv Labs sent over for review.  --- Pt states is leaving Saturday and needs the refill Rx for the 2 month she will be gone, promises to come in for OV upon return to the state.  --Pt uses [facility]

## 2017-03-11 NOTE — Telephone Encounter (Signed)
Pt has not been seen since 03/2016.  I denied refill for atorvastatin this morning.  I have also asked Baker Janus to call [facility] and requests copies of labs to determine if she has had LFTs and FLP recently.  Please advise.  Charyl Bigger, CMA

## 2017-03-12 ENCOUNTER — Telehealth: Payer: Self-pay | Admitting: Family Medicine

## 2017-03-12 NOTE — Telephone Encounter (Signed)
Patient is requesting a refill of her atorvastatin and fluoxetine sent to [facility]. She knows she needs a med f/u for these but will be out of the country in [place] the next two months on a mission trip. She has sch a med f/u in Oct when she will be back but will be out of meds before then. She stated that she has had recent labs done at another office (unspecified specialist) and everything was normal. Please advise

## 2017-03-13 NOTE — Telephone Encounter (Signed)
Absolutely not please do not refill any other medicines for this patient.    They only saw her one time about a year ago and told her to follow up in 2 weeks for blood work etc.  She is a diabetic and I do not have a lipid panel on her, or any other labs for over a year.    Please do not refill any medicines for pt until she is seen by [place] and a full set of fasting labs will need to be obtained before any refills will be provided

## 2017-03-13 NOTE — Telephone Encounter (Signed)
Patient notified. MPulliam, CMA/RT(R)  

## 2017-03-13 NOTE — Telephone Encounter (Signed)
Spoke to Dr. Raliegh Scarlet, after reviewing the patient's chart patient is diabetic, taking a statin for cholesterol and has not had any recent follow ups with the office.  Patient has not had any recent lab work to include an A1C or a lipid panel.  Patient has not been seen in the office since 03/2016.  Per Dr. Raliegh Scarlet patient needs to come in for a follow up office visit and fasting blood work before medications can be refilled.  Patient notified.  MPulliam, CMA/RT(R)

## 2017-05-04 ENCOUNTER — Ambulatory Visit (INDEPENDENT_AMBULATORY_CARE_PROVIDER_SITE_OTHER): Payer: BLUE CROSS/BLUE SHIELD | Admitting: Family Medicine

## 2017-05-04 ENCOUNTER — Encounter: Payer: Self-pay | Admitting: Family Medicine

## 2017-05-04 VITALS — BP 122/84 | HR 76 | Ht 66.0 in | Wt 162.5 lb

## 2017-05-04 DIAGNOSIS — E118 Type 2 diabetes mellitus with unspecified complications: Secondary | ICD-10-CM | POA: Diagnosis not present

## 2017-05-04 DIAGNOSIS — F418 Other specified anxiety disorders: Secondary | ICD-10-CM | POA: Diagnosis not present

## 2017-05-04 DIAGNOSIS — M05711 Rheumatoid arthritis with rheumatoid factor of right shoulder without organ or systems involvement: Secondary | ICD-10-CM | POA: Diagnosis not present

## 2017-05-04 DIAGNOSIS — Z0000 Encounter for general adult medical examination without abnormal findings: Secondary | ICD-10-CM

## 2017-05-04 DIAGNOSIS — R945 Abnormal results of liver function studies: Secondary | ICD-10-CM

## 2017-05-04 DIAGNOSIS — N951 Menopausal and female climacteric states: Secondary | ICD-10-CM | POA: Diagnosis not present

## 2017-05-04 DIAGNOSIS — R7989 Other specified abnormal findings of blood chemistry: Secondary | ICD-10-CM

## 2017-05-04 DIAGNOSIS — E119 Type 2 diabetes mellitus without complications: Secondary | ICD-10-CM | POA: Diagnosis not present

## 2017-05-04 DIAGNOSIS — F409 Phobic anxiety disorder, unspecified: Secondary | ICD-10-CM

## 2017-05-04 DIAGNOSIS — Z860100 Personal history of colon polyps, unspecified: Secondary | ICD-10-CM

## 2017-05-04 DIAGNOSIS — E1169 Type 2 diabetes mellitus with other specified complication: Secondary | ICD-10-CM | POA: Diagnosis not present

## 2017-05-04 DIAGNOSIS — E559 Vitamin D deficiency, unspecified: Secondary | ICD-10-CM

## 2017-05-04 DIAGNOSIS — F5105 Insomnia due to other mental disorder: Secondary | ICD-10-CM

## 2017-05-04 DIAGNOSIS — E785 Hyperlipidemia, unspecified: Secondary | ICD-10-CM | POA: Diagnosis not present

## 2017-05-04 LAB — POCT UA - MICROALBUMIN
Albumin/Creatinine Ratio, Urine, POC: <30
Creatinine, POC: 100 mg/dL
Microalbumin Ur, POC: 10 mg/L

## 2017-05-04 MED ORDER — FLUOXETINE HCL 20 MG PO TABS: ORAL_TABLET | ORAL | 2 refills | Status: DC

## 2017-05-04 MED ORDER — ESTROGENS, CONJUGATED 0.625 MG/GM VA CREA: 1.0000 | TOPICAL_CREAM | Freq: Every day | VAGINAL | 11 refills | Status: AC

## 2017-05-04 MED ORDER — VITAMIN D (ERGOCALCIFEROL) 1.25 MG (50000 UNIT) PO CAPS: ORAL_CAPSULE | ORAL | 3 refills | Status: DC

## 2017-05-04 NOTE — Patient Instructions (Signed)
Since you been off your cholesterol meds for 4 weeks, we will check it today but, if LDL is at goal of less than 100, I recommend it be repeated in 3 months!!   This will be more accurate depiction of what it is when you're off medicines for a period of time.  Please realize, EXERCISE IS MEDICINE!  -  American Heart Association [facility]) guidelines for exercise : If you are in good health, without any medical conditions, you should engage in 150 minutes of moderate intensity aerobic activity per week.  This means you should be huffing and puffing throughout your workout.   Engaging in regular exercise will improve brain function and memory, as well as improve mood, boost immune system and help with weight management.  As well as the other, more well-known effects of exercise such as decreasing blood sugar levels, decreasing blood pressure,  and decreasing bad cholesterol levels/ increasing good cholesterol levels.     -  The AHA strongly endorses consumption of a diet that contains a variety of foods from all the food categories with an emphasis on fruits and vegetables; fat-free and low-fat dairy products; cereal and grain products; legumes and nuts; and fish, poultry, and/or extra lean meats.    Excessive food intake, especially of foods high in saturated and trans fats, sugar, and salt, should be avoided.    Adequate water intake of roughly 1/2 of your weight in pounds, should equal the ounces of water per day you should drink.  So for instance, if you're 200 pounds, that would be 100 ounces of water per day.         Mediterranean Diet  Why follow it? Research shows. . Those who follow the Mediterranean diet have a reduced risk of heart disease  . The diet is associated with a reduced incidence of Parkinson's and Alzheimer's diseases . People following the diet may have longer life expectancies and lower rates of chronic diseases  . The Dietary Guidelines for Americans recommends the Mediterranean  diet as an eating plan to promote health and prevent disease  What Is the Mediterranean Diet?  . Healthy eating plan based on typical foods and recipes of Mediterranean-style cooking . The diet is primarily a plant based diet; these foods should make up a majority of meals   Starches - Plant based foods should make up a majority of meals - They are an important sources of vitamins, minerals, energy, antioxidants, and fiber - Choose whole grains, foods high in fiber and minimally processed items  - Typical grain sources include wheat, oats, barley, corn, brown rice, bulgar, farro, millet, polenta, couscous  - Various types of beans include chickpeas, lentils, fava beans, black beans, white beans   Fruits  Veggies - Large quantities of antioxidant rich fruits & veggies; 6 or more servings  - Vegetables can be eaten raw or lightly drizzled with oil and cooked  - Vegetables common to the traditional Mediterranean Diet include: artichokes, arugula, beets, broccoli, brussel sprouts, cabbage, carrots, celery, collard greens, cucumbers, eggplant, kale, leeks, lemons, lettuce, mushrooms, okra, onions, peas, peppers, potatoes, pumpkin, radishes, rutabaga, shallots, spinach, sweet potatoes, turnips, zucchini - Fruits common to the Mediterranean Diet include: apples, apricots, avocados, cherries, clementines, dates, figs, grapefruits, grapes, melons, nectarines, oranges, peaches, pears, pomegranates, strawberries, tangerines  Fats - Replace butter and margarine with healthy oils, such as olive oil, canola oil, and tahini  - Limit nuts to no more than a handful a day  - Nuts  include walnuts, almonds, pecans, pistachios, pine nuts  - Limit or avoid candied, honey roasted or heavily salted nuts - Olives are central to the Mediterranean diet - can be eaten whole or used in a variety of dishes   Meats Protein - Limiting red meat: no more than a few times a month - When eating red meat: choose lean cuts and keep  the portion to the size of deck of cards - Eggs: approx. 0 to 4 times a week  - Fish and lean poultry: at least 2 a week  - Healthy protein sources include, chicken, [place], lean beef, lamb - Increase intake of seafood such as tuna, salmon, trout, mackerel, shrimp, scallops - Avoid or limit high fat processed meats such as sausage and bacon  Dairy - Include moderate amounts of low fat dairy products  - Focus on healthy dairy such as fat free yogurt, skim milk, low or reduced fat cheese - Limit dairy products higher in fat such as whole or 2% milk, cheese, ice cream  Alcohol - Moderate amounts of red wine is ok  - No more than 5 oz daily for women (all ages) and men older than age 66  - No more than 10 oz of wine daily for men younger than 29  Other - Limit sweets and other desserts  - Use herbs and spices instead of salt to flavor foods  - Herbs and spices common to the traditional Mediterranean Diet include: basil, bay leaves, chives, cloves, cumin, fennel, garlic, lavender, marjoram, mint, oregano, parsley, pepper, rosemary, sage, savory, sumac, tarragon, thyme   It's not just a diet, it's a lifestyle:  . The Mediterranean diet includes lifestyle factors typical of those in the region  . Foods, drinks and meals are best eaten with others and savored . Daily physical activity is important for overall good health . This could be strenuous exercise like running and aerobics . This could also be more leisurely activities such as walking, housework, yard-work, or taking the stairs . Moderation is the key; a balanced and healthy diet accommodates most foods and drinks . Consider portion sizes and frequency of consumption of certain foods   Meal Ideas & Options:  . Breakfast:  o Whole wheat toast or whole wheat English muffins with peanut butter & hard boiled egg o Steel cut oats topped with apples & cinnamon and skim milk  o Fresh fruit: banana, strawberries, melon, berries, peaches   o Smoothies: strawberries, bananas, greek yogurt, peanut butter o Low fat greek yogurt with blueberries and granola  o Egg white omelet with spinach and mushrooms o Breakfast couscous: whole wheat couscous, apricots, skim milk, cranberries  . Sandwiches:  o Hummus and grilled vegetables (peppers, zucchini, squash) on whole wheat bread   o Grilled chicken on whole wheat pita with lettuce, tomatoes, cucumbers or tzatziki  o Tuna salad on whole wheat bread: tuna salad made with greek yogurt, olives, red peppers, capers, green onions o Garlic rosemary lamb pita: lamb sauted with garlic, rosemary, salt & pepper; add lettuce, cucumber, greek yogurt to pita - flavor with lemon juice and black pepper  . Seafood:  o Mediterranean grilled salmon, seasoned with garlic, basil, parsley, lemon juice and black pepper o Shrimp, lemon, and spinach whole-grain pasta salad made with low fat greek yogurt  o Seared scallops with lemon orzo  o Seared tuna steaks seasoned salt, pepper, coriander topped with tomato mixture of olives, tomatoes, olive oil, minced garlic, parsley, green onions and cappers  .  Meats:  o Herbed greek chicken salad with kalamata olives, cucumber, feta  o Red bell peppers stuffed with spinach, bulgur, lean ground beef (or lentils) & topped with feta   o Kebabs: skewers of chicken, tomatoes, onions, zucchini, squash  o [place] burgers: made with red onions, mint, dill, lemon juice, feta cheese topped with roasted red peppers . Vegetarian o Cucumber salad: cucumbers, artichoke hearts, celery, red onion, feta cheese, tossed in olive oil & lemon juice  o Hummus and whole grain pita points with a greek salad (lettuce, tomato, feta, olives, cucumbers, red onion) o Lentil soup with celery, carrots made with vegetable broth, garlic, salt and pepper  o Tabouli salad: parsley, bulgur, mint, scallions, cucumbers, tomato, radishes, lemon juice, olive oil, salt and pepper.

## 2017-05-04 NOTE — Progress Notes (Signed)
Impression and Recommendations:    1. Depression with anxiety   2. 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 Vitamin D deficiency   4. Menopausal vaginal dryness   5. Personal history of colonic polyps- per patient recall approx 2007   6. Diabetes mellitus type 2 in nonobese (HCC)   7. Elevated LFTs   8. Hyperlipidemia with target LDL less than 100   9. Insomnia due to anxiety and fear   10. Type 2 diabetes mellitus with complication, without long-term current use of insulin ([place])   11. Hyperlipidemia associated with type 2 diabetes mellitus ([place])    -  Patient was upset  that we wouldn't refill medications for her without her being seen and I explained to her why we need to see her more often then once every 13-14 months.    I explained I only seen her one other time and with her diabetes she needs to come in 3 times a year minimum.  It's recommended she come in for yearly physical as well.  -   Patient will call [facility] for  Mammogram -  And colonoscopy up-to-date and recently obtained in May 2018.  Not due to another 10 years.   Since patient was last seen over a year ago, she's been diagnosed with vitamin D deficiency.   She requests refill of ergocalciferol which was given today.  Labs will be obtained today. -   Hold off statin, may need recheck in 3-4 months.  This will be more accurate after you been off the medicines for 3-4 months and set of just 4 weeks. -   Blood pressure well controlled.  We will obtain blood work and see what kidney and liver function is.   May consider adding ARB and statin depending on A1c result and U Microalbu etc -  Restart Prozac.  Start at 20 mg daily for 1 week then 40 mg daily.  Continue counseling.  Exercises daily, healthy diet daily;  No SI/ HI -   For her vaginal dryness, patient would like a refill of her Premarin.  She been on this in the past but only took it sporadically.  - Pt  was in the office today for 40+ minutes, with over 50% time spent in face to face counseling of patients various medical conditions, treatment plans of those medical conditions including medicine management and lifestyle modification, strategies to improve health and well being; and in coordination of care. SEE ABOVE FOR DETAILS   Education and routine counseling performed. Handouts provided.  New Prescriptions   FLUOXETINE (PROZAC) 20 MG TABLET    1 tab daily for a week, then 2 by mouth daily   VITAMIN D, ERGOCALCIFEROL, (DRISDOL) 50000 UNITS CAPS CAPSULE    Take one tablet wkly    Meds ordered this encounter  Medications  . FLUoxetine (PROZAC) 20 MG tablet    Sig: 1 tab daily for a week, then 2 by mouth daily    Dispense:  60 tablet    Refill:  2  . Vitamin D, Ergocalciferol, (DRISDOL) 50000 units CAPS capsule    Sig: Take one tablet wkly    Dispense:  12 capsule    Refill:  3  . conjugated estrogens (PREMARIN) vaginal cream    Sig: Place 1 Applicatorful vaginally at bedtime.    Dispense:  30 g    Refill:  11   Pt was in the office today for 40+  minutes, with over 50% time spent in face to face counseling of various medical concerns and in coordination of care  Modified Medications   Modified Medication Previous Medication   CONJUGATED ESTROGENS (PREMARIN) VAGINAL CREAM conjugated estrogens (PREMARIN) vaginal cream      Place 1 Applicatorful vaginally at bedtime.    Place 1 Applicatorful vaginally daily.    Discontinued Medications   FLUOXETINE (PROZAC) 40 MG CAPSULE    TAKE 1 CAPSULE BY MOUTH DAILY.     Orders Placed This Encounter  Procedures  . CBC with Differential/Platelet  . Comprehensive metabolic panel  . Hemoglobin A1c  . HIV antibody  . Hepatitis C antibody  . Lipid panel  . VITAMIN D 25 Hydroxy (Vit-D Deficiency, Fractures)  . TSH  . T4, free  . Microalbumin / creatinine urine ratio  . HM Diabetes Foot Exam     Return in about 4 months (around  09/04/2017) for 4 mo f/up HLD, diet controlled DM, etc.  The patient was counseled, risk factors were discussed, anticipatory guidance given.  Gross side effects, risk and benefits, and alternatives of medications discussed with patient.  Patient is aware that all medications have potential side effects and we are unable to predict every side effect or drug-drug interaction that may occur.  Expresses verbal understanding and consents to current therapy plan and treatment regimen.  Please see AVS handed out to patient at the end of our visit for further patient instructions/ counseling done pertaining to today's office visit.    Note: This document was prepared using Dragon voice recognition software and may include unintentional dictation errors.     Subjective:    Chief Complaint  Patient presents with  . Annual Exam     Michele Adkins is a 61 y.o. female who presents to [facility] today for Diabetes Management.    Michele Adkins- counselor- twice monthly.   Mood:  Not good- been off prozac for about 6 wks now.   Have not seen pt in over a year.   This is pt's second OV with me.  Last seen over a year ago.  Never had Bld pressure problem.    CHol:     Patient has been off her medicines for approximately one month now.  But she has been working on diet and exercise usually.  Not for the past couple weeks and [place]'s been [place] trips.   DM HPI: -  She has not been working on diet and exercise for diabetes; usually walks daily 1 hour to - 1:15 min daily.   Pt is currently maintained on the following medications for diabetes:   see med list today Medication compliance - n/a - diet controlled.    Home glucose readings range doesn't check.    Denies polyuria/polydipsia. Denies hypo/ hyperglycemia symptoms - She denies new onset of: chest pain, exercise intolerance, shortness of breath, dizziness, visual changes, headache, lower  extremity swelling or claudication.    Last diabetic eye exam was  Lab Results  Component Value Date   HMDIABEYEEXA No Retinopathy 04/14/2014    Foot exam- UTD  Last A1C in the office was:  Lab Results  Component Value Date   HGBA1C 6.0 09/15/2016   HGBA1C 6.0 04/03/2016   HGBA1C 6.2 12/03/2015    Lab Results  Component Value Date   MICROALBUR 0.2 04/03/2016   LDLCALC 100 (H) 12/03/2015   CREATININE 0.7 02/05/2017  Last 3 blood pressure readings in our office are as follows: BP Readings from Last 3 Encounters:  05/04/17 122/84  11/24/16 (!) 104/54  11/11/16 128/72    BMI Readings from Last 3 Encounters:  05/04/17 26.23 kg/m  11/24/16 26.31 kg/m  11/11/16 26.31 kg/m     Problem  Vitamin D Deficiency    Vitamin D obtained by her rheumatologist in July was 77.  Patient on once weekly medicine.   And also usually takes a once daily as well.   Hyperlipidemia Associated With Type 2 Diabetes Mellitus (Hcc)  Menopausal Vaginal Dryness   Last pap 1.5 yrs ago.    Patient due for repeat Pap in December 2019.    Was normal when last done 12\20\16.     Type 2 Diabetes Mellitus With Complication, Without Long-Term Current Use of Insulin (Hcc)  Rheumatoid Arthritis (Hcc)   Dr Dossie Der-  Rheum seeing and txing her for this.  Sx stable.    Personal history of colonic polyps- per patient recall approx 2007   Inflammatory polyp in 2008 [facility]) Is routine risk (q 10 year colonoscopy)  UPDATE:   seen recently by Dr. Carlean Purl of gastroenterology in May 2018.  Showed small internal hemorrhoids only.  No polyp.   - Repeat 10 years.   Renal Mass, Right (Resolved)      Patient Care Team    Relationship Specialty Notifications Start End  Mellody Dance, DO PCP - General Family Medicine  04/03/16   Valinda Party, MD  Rheumatology  04/03/16   Calvert Cantor, MD Consulting Physician Ophthalmology  04/03/16   Gatha Mayer, MD Consulting Physician Gastroenterology   04/05/16   Marius Ditch, MD Attending Physician Internal Medicine  05/04/17    Comment: Sleep counseling; insomnia--  OSA testing -     Patient Active Problem List   Diagnosis Date Noted  . Vitamin D deficiency 05/04/2017  . Hyperlipidemia associated with type 2 diabetes mellitus ([place]) 05/04/2017  . Abnormal SPEP 05/02/2016  . 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 Elevated blood pressure 04/17/2016  . 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 Elevated LFTs 07/11/2015  . Allergic rhinitis due to pollen 05/29/2014  . 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 Menopausal vaginal dryness 01/26/2014  . Depression with anxiety 03/11/2013  . Routine general medical examination at a health care facility 03/01/2012  . Type 2 diabetes mellitus with complication, without long-term current use of insulin ([place]) 03/01/2012  . Hyperlipidemia with target LDL less than 100 03/01/2012  . Osteopenia 03/01/2012  . Rheumatoid arthritis ([place]) 03/01/2012  . Personal history of colonic polyps- per patient recall approx 2007 03/01/2012     Past Medical History:  Diagnosis Date  . Allergy   . Depression   . Diabetes mellitus without complication ([place])   . Hyperlipidemia   . Osteopenia 2007  . Rheumatoid arthritis(714.0) 2001  . Sleep disorder      Past Surgical History:  Procedure Laterality Date  . TONSILLECTOMY       Family History  Problem Relation Age of Onset  . Diabetes Mother   . Hypertension Father   . Diabetes Brother   . Colon cancer Maternal Grandfather 79  . Diabetes Sister   . Cancer Sister        breast ca  . Alcohol abuse Neg Hx   . Depression Neg Hx   . Early death Neg Hx   . Heart disease Neg Hx   . Hyperlipidemia  Neg Hx   . Kidney disease Neg Hx   . Stroke Neg Hx      History  Drug Use No  ,  History  Alcohol Use No  ,  History  Smoking Status  . Never Smoker  Smokeless Tobacco  . Never Used  ,    Current  Outpatient Prescriptions on File Prior to Visit  Medication Sig Dispense Refill  . aspirin 81 MG tablet Take 81 mg by mouth daily.    . Eszopiclone 3 MG TABS   1  . folic acid (FOLVITE) 1 MG tablet Take 1 mg by mouth daily.   3  . methotrexate 250 MG/10ML injection Inject 60 mg into the skin once a week.   1  . Omega-3 Fatty Acids (SUPER OMEGA-3) 1000 MG CAPS Take 1 capsule by mouth 2 (two) times daily.    Marland Kitchen atorvastatin (LIPITOR) 20 MG tablet Take 1 tablet (20 mg total) by mouth daily. (Patient not taking: Reported on 05/04/2017) 90 tablet 0  . Probiotic Product (PROBIOTIC-10 PO) Take 1 tablet by mouth daily.    . TURMERIC PO Take 120 mg by mouth 2 (two) times daily.    . Wheat Dextrin (BENEFIBER) POWD 1-3 tablespoons daily (Patient not taking: Reported on 05/04/2017)  0   No current facility-administered medications on file prior to visit.      Allergies  Allergen Reactions  . Sulfa Drugs Cross Reactors Hives     Review of Systems:   General:  Denies fever, chills Optho/Auditory:   Denies visual changes, blurred vision Respiratory:   Denies SOB, cough, wheeze, DIB  Cardiovascular:   Denies chest pain, palpitations, painful respirations Gastrointestinal:   Denies nausea, vomiting, diarrhea.  Endocrine:     Denies new hot or cold intolerance Musculoskeletal:  Denies joint swelling, gait issues, or new unexplained myalgias/ arthralgias Skin:  Denies rash, suspicious lesions  Neurological:    Denies dizziness, unexplained weakness, numbness  Psychiatric/Behavioral:   Denies mood changes    Objective:     Blood pressure 122/84, pulse 76, height 5\' 6"  (1.676 m), weight 162 lb 8 oz (73.7 kg).  Body mass index is 26.23 kg/m.  General: Well Developed, well nourished, and in no acute distress.  HEENT: Normocephalic, atraumatic, pupils equal round reactive to light, neck supple, No carotid bruits, no JVD Skin: Warm and dry, cap RF less 2 sec Cardiac: Regular rate and rhythm, S1,  S2 WNL's, no murmurs rubs or gallops Respiratory: ECTA B/L, Not using accessory muscles, speaking in full sentences. NeuroM-Sk: Ambulates w/o assistance, moves ext * 4 w/o difficulty, sensation grossly intact.  Ext: scant edema b/l lower ext Psych: No HI/SI, judgement and insight good, Euthymic mood. Full Affect.

## 2017-05-09 LAB — COMPREHENSIVE METABOLIC PANEL
ALT: 82 IU/L — ABNORMAL HIGH (ref 0–32)
AST: 43 IU/L — ABNORMAL HIGH (ref 0–40)
Albumin/Globulin Ratio: 1.7 (ref 1.2–2.2)
Albumin: 4.5 g/dL (ref 3.6–4.8)
Alkaline Phosphatase: 113 IU/L (ref 39–117)
BUN/Creatinine Ratio: 16 (ref 12–28)
BUN: 10 mg/dL (ref 8–27)
Bilirubin Total: 0.6 mg/dL (ref 0.0–1.2)
CO2: 26 mmol/L (ref 20–29)
Calcium: 10.5 mg/dL — ABNORMAL HIGH (ref 8.7–10.3)
Chloride: 97 mmol/L (ref 96–106)
Creatinine, Ser: 0.63 mg/dL (ref 0.57–1.00)
GFR calc Af Amer: 112 mL/min/{1.73_m2} (ref >59)
GFR calc non Af Amer: 97 mL/min/{1.73_m2} (ref >59)
Globulin, Total: 2.7 g/dL (ref 1.5–4.5)
Glucose: 91 mg/dL (ref 65–99)
Potassium: 4.4 mmol/L (ref 3.5–5.2)
Sodium: 140 mmol/L (ref 134–144)
Total Protein: 7.2 g/dL (ref 6.0–8.5)

## 2017-05-09 LAB — LIPID PANEL
Chol/HDL Ratio: 5 ratio — ABNORMAL HIGH (ref 0.0–4.4)
Cholesterol, Total: 260 mg/dL — ABNORMAL HIGH (ref 100–199)
HDL: 52 mg/dL (ref >39)
LDL Calculated: 178 mg/dL — ABNORMAL HIGH (ref 0–99)
Triglycerides: 148 mg/dL (ref 0–149)
VLDL Cholesterol Cal: 30 mg/dL (ref 5–40)

## 2017-05-09 LAB — CBC WITH DIFFERENTIAL/PLATELET
Basophils Absolute: 0 10*3/uL (ref 0.0–0.2)
Basos: 0 %
EOS (ABSOLUTE): 0.2 10*3/uL (ref 0.0–0.4)
Eos: 2 %
Hematocrit: 42.1 % (ref 34.0–46.6)
Hemoglobin: 14.2 g/dL (ref 11.1–15.9)
Immature Grans (Abs): 0 10*3/uL (ref 0.0–0.1)
Immature Granulocytes: 0 %
Lymphocytes Absolute: 2.4 10*3/uL (ref 0.7–3.1)
Lymphs: 36 %
MCH: 30 pg (ref 26.6–33.0)
MCHC: 33.7 g/dL (ref 31.5–35.7)
MCV: 89 fL (ref 79–97)
Monocytes Absolute: 0.7 10*3/uL (ref 0.1–0.9)
Monocytes: 11 %
Neutrophils Absolute: 3.3 10*3/uL (ref 1.4–7.0)
Neutrophils: 51 %
Platelets: 487 10*3/uL — ABNORMAL HIGH (ref 150–379)
RBC: 4.74 x10E6/uL (ref 3.77–5.28)
RDW: 15.5 % — ABNORMAL HIGH (ref 12.3–15.4)
WBC: 6.6 10*3/uL (ref 3.4–10.8)

## 2017-05-09 LAB — HIV 1/2 AB DIFFERENTIATION
HIV 1 Ab: NEGATIVE
HIV 2 Ab: NEGATIVE
NOTE (HIV CONF MULTIP: NEGATIVE

## 2017-05-09 LAB — HEMOGLOBIN A1C
Est. average glucose Bld gHb Est-mCnc: 131 mg/dL
Hgb A1c MFr Bld: 6.2 % — ABNORMAL HIGH (ref 4.8–5.6)

## 2017-05-09 LAB — RNA QUALITATIVE: HIV 1 RNA Qualitative: NEGATIVE

## 2017-05-09 LAB — TSH: TSH: 2.96 u[IU]/mL (ref 0.450–4.500)

## 2017-05-09 LAB — HEPATITIS C ANTIBODY: Hep C Virus Ab: <0.1 s/co ratio (ref 0.0–0.9)

## 2017-05-09 LAB — VITAMIN D 25 HYDROXY (VIT D DEFICIENCY, FRACTURES): Vit D, 25-Hydroxy: 40.7 ng/mL (ref 30.0–100.0)

## 2017-05-09 LAB — HIV ANTIBODY (ROUTINE TESTING W REFLEX): HIV Screen 4th Generation wRfx: REACTIVE — AB

## 2017-05-09 LAB — T4, FREE: Free T4: 1.34 ng/dL (ref 0.82–1.77)

## 2017-05-12 ENCOUNTER — Encounter: Payer: Self-pay | Admitting: Family Medicine

## 2017-05-15 ENCOUNTER — Other Ambulatory Visit: Payer: BLUE CROSS/BLUE SHIELD

## 2017-05-18 ENCOUNTER — Other Ambulatory Visit (INDEPENDENT_AMBULATORY_CARE_PROVIDER_SITE_OTHER): Payer: BLUE CROSS/BLUE SHIELD

## 2017-05-18 DIAGNOSIS — F331 Major depressive disorder, recurrent, moderate: Secondary | ICD-10-CM | POA: Diagnosis not present

## 2017-05-18 DIAGNOSIS — R899 Unspecified abnormal finding in specimens from other organs, systems and tissues: Secondary | ICD-10-CM

## 2017-05-18 DIAGNOSIS — Z114 Encounter for screening for human immunodeficiency virus [HIV]: Secondary | ICD-10-CM

## 2017-05-20 DIAGNOSIS — Z1231 Encounter for screening mammogram for malignant neoplasm of breast: Secondary | ICD-10-CM | POA: Diagnosis not present

## 2017-05-20 NOTE — Progress Notes (Signed)
Per Dr. 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 Kathlene November at [facility] was the physician consulted.  Charyl Bigger, CMA

## 2017-05-20 NOTE — Telephone Encounter (Signed)
Kenney Houseman, do you remember yesterday you gave me the telephone and I spoke with patient's rheumatologist.  I forget the practice that you called and the name of the specific Dr., so if you could recall that in chart, please document.  I appreciate it.    Patient's rheumatologist confirmed that that is not the case and that he has never heard of false positive HIV test due to patient's rheumatological condition.   -We will await test results and send to ID if needed

## 2017-05-21 LAB — HIV-1 RNA, QUALITATIVE, TMA: HIV-1 RNA Qualitative: NEGATIVE

## 2017-05-22 ENCOUNTER — Ambulatory Visit: Payer: BLUE CROSS/BLUE SHIELD | Admitting: Family Medicine

## 2017-05-25 DIAGNOSIS — F331 Major depressive disorder, recurrent, moderate: Secondary | ICD-10-CM | POA: Diagnosis not present

## 2017-05-26 ENCOUNTER — Ambulatory Visit (INDEPENDENT_AMBULATORY_CARE_PROVIDER_SITE_OTHER): Payer: BLUE CROSS/BLUE SHIELD | Admitting: Family Medicine

## 2017-05-26 ENCOUNTER — Encounter: Payer: Self-pay | Admitting: Family Medicine

## 2017-05-26 VITALS — BP 128/80 | HR 69 | Ht 66.0 in | Wt 164.0 lb

## 2017-05-26 DIAGNOSIS — E118 Type 2 diabetes mellitus with unspecified complications: Secondary | ICD-10-CM

## 2017-05-26 DIAGNOSIS — E1169 Type 2 diabetes mellitus with other specified complication: Secondary | ICD-10-CM | POA: Diagnosis not present

## 2017-05-26 DIAGNOSIS — J301 Allergic rhinitis due to pollen: Secondary | ICD-10-CM

## 2017-05-26 DIAGNOSIS — E785 Hyperlipidemia, unspecified: Secondary | ICD-10-CM | POA: Diagnosis not present

## 2017-05-26 DIAGNOSIS — R945 Abnormal results of liver function studies: Secondary | ICD-10-CM | POA: Diagnosis not present

## 2017-05-26 DIAGNOSIS — E559 Vitamin D deficiency, unspecified: Secondary | ICD-10-CM

## 2017-05-26 DIAGNOSIS — R7989 Other specified abnormal findings of blood chemistry: Secondary | ICD-10-CM

## 2017-05-26 NOTE — Progress Notes (Signed)
Assessment and plan:  1. Type 2 diabetes mellitus with complication, without long-term current use of insulin ([name])   2. Allergic rhinitis due to pollen, unspecified seasonality   3. Hyperlipidemia associated with type 2 diabetes mellitus (HCC)   4. Elevated LFTs   5. Vitamin D deficiency     1. Allergic rhinitis due to pollen, unspecified seasonality  Other orders - fluticasone (FLONASE) 50 MCG/ACT nasal spray; Place 2 sprays daily into both nostrils.    Education and routine counseling performed. Handouts provided.   Return in about 3 months (around 08/26/2017) for labs 1 wk prior to a 3-4 mo f/up OV with me.   Anticipatory guidance and routine counseling done re: condition, txmnt options and need for follow up. All questions of patient's were answered.   Gross side effects, risk and benefits, and alternatives of medications discussed with patient.  Patient is aware that all medications have potential side effects and we are unable to predict every sideeffect or drug-drug interaction that may occur.  Expresses verbal understanding and consents to current therapy plan and treatment regiment.  Please see AVS handed out to patient at the end of our visit for additional patient instructions/ counseling done pertaining to today's office visit.  Note: This document was prepared using Dragon voice recognition software and may include unintentional dictation errors.   ----------------------------------------------------------------------------------------------------------------------  Subjective:   CC:   Michele Adkins is a 61 y.o. female who presents to [place] at [facility] today for review and discussion of recent bloodwork that was done.  1. All recent blood work that we ordered was reviewed with patient today.  Patient was counseled on all abnormalities and we discussed dietary and lifestyle  changes that could help those values (also medications when appropriate).  Extensive health counseling performed and all patient's concerns/ questions were addressed.   2. Restarted prozac -  Doing well on it.  1 wk away from being herslf/ feeling herself.  She is doing some deep breathing exercises and listens to sleep meditation etc. at night to help her sleep.  She also is going for counseling which is helping her as well.  Twice monthly  3.     Wt Readings from Last 3 Encounters:  05/26/17 164 lb (74.4 kg)  05/04/17 162 lb 8 oz (73.7 kg)  11/24/16 163 lb (73.9 kg)   BP Readings from Last 3 Encounters:  05/26/17 128/80  05/04/17 122/84  11/24/16 (!) 104/54   Pulse Readings from Last 3 Encounters:  05/26/17 69  05/04/17 76  11/24/16 (!) 52   BMI Readings from Last 3 Encounters:  05/26/17 26.47 kg/m  05/04/17 26.23 kg/m  11/24/16 26.31 kg/m     Patient Care Team    Relationship Specialty Notifications Start End  Mellody Dance, DO PCP - General Family Medicine  04/03/16   Valinda Party, MD  Rheumatology  04/03/16   Calvert Cantor, MD Consulting Physician Ophthalmology  04/03/16   Gatha Mayer, MD Consulting Physician Gastroenterology  04/05/16   Marius Ditch, MD Attending Physician Internal Medicine  05/04/17    Comment: Sleep counseling; insomnia--  OSA testing -    Full medical history updated and reviewed in the office today  Patient Active Problem List   Diagnosis Date Noted  . Hyperlipidemia associated with type 2 diabetes mellitus ([place]) 05/04/2017    Priority: High  . Type 2 diabetes mellitus with complication, without long-term current use of insulin (  [place]) 03/01/2012    Priority: High  . Allergic rhinitis due to pollen 05/29/2014    Priority: Low  . Insomnia due to anxiety and fear 01/26/2014    Priority: Low  . Vitamin D deficiency 05/04/2017  . Abnormal SPEP 05/02/2016  . 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  Elevated blood pressure 04/17/2016  . 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 Elevated LFTs 07/11/2015  . Menopausal vaginal dryness 01/26/2014  . Depression with anxiety 03/11/2013  . Routine general medical examination at a health care facility 03/01/2012  . Hyperlipidemia with target LDL less than 100 03/01/2012  . Osteopenia 03/01/2012  . Rheumatoid arthritis ([place]) 03/01/2012  . Personal history of colonic polyps- per patient recall approx 2007 03/01/2012    Past Medical History:  Diagnosis Date  . Allergy   . Depression   . Diabetes mellitus without complication ([place])   . Hyperlipidemia   . Osteopenia 2007  . Rheumatoid arthritis(714.0) 2001  . Sleep disorder     Past Surgical History:  Procedure Laterality Date  . TONSILLECTOMY      Social History   Tobacco Use  . Smoking status: Never Smoker  . Smokeless tobacco: Never Used  Substance Use Topics  . Alcohol use: No    Family Hx: Family History  Problem Relation Age of Onset  . Diabetes Mother   . Hypertension Father   . Diabetes Brother   . Colon cancer Maternal Grandfather 30  . Diabetes Sister   . Cancer Sister        breast ca  . Alcohol abuse Neg Hx   . Depression Neg Hx   . Early death Neg Hx   . Heart disease Neg Hx   . Hyperlipidemia Neg Hx   . Kidney disease Neg Hx   . Stroke Neg Hx     Medications:  Current Outpatient Medications:  .  aspirin 81 MG tablet, Take 81 mg by mouth daily., Disp: , Rfl:  .  atorvastatin (LIPITOR) 20 MG tablet, Take 1 tablet (20 mg total) by mouth daily. (Patient not taking: Reported on 07/22/2017), Disp: 90 tablet, Rfl: 0 .  conjugated estrogens (PREMARIN) vaginal cream, Place 1 Applicatorful vaginally at bedtime., Disp: 30 g, Rfl: 11 .  Eszopiclone 3 MG TABS, , Disp: , Rfl: 1 .  fluticasone (FLONASE) 50 MCG/ACT nasal spray, Place 2 sprays daily into both nostrils., Disp: 16 g, Rfl: 1 .  folic acid (FOLVITE) 1 MG tablet, Take 1 mg by mouth daily. , Disp: , Rfl:  3 .  methotrexate 250 MG/10ML injection, Inject 60 mg into the skin once a week. , Disp: , Rfl: 1 .  Probiotic Product (PROBIOTIC-10 PO), Take 1 tablet by mouth daily., Disp: , Rfl:  .  TURMERIC PO, Take 120 mg by mouth 2 (two) times daily., Disp: , Rfl:  .  Vitamin D, Ergocalciferol, (DRISDOL) 50000 units CAPS capsule, Take one tablet wkly, Disp: 12 capsule, Rfl: 3 .  FLUoxetine (PROZAC) 20 MG capsule, TAKE 1 CAPSULE BY MOUTH ONCE DAILY FOR 1 WEEK, THEN 2 CAPSULES DAILY THEREAFTER., Disp: 60 capsule, Rfl: 2    Allergies:  Allergies  Allergen Reactions  . Sulfa Drugs Cross Reactors Hives    Review of Systems: General:   No F/C, wt loss Pulm:   No DIB, SOB, pleuritic chest pain Card:  No CP, palpitations Abd:  No n/v/d or pain Ext:  No inc edema from baseline  Objective:  Blood pressure 128/80, pulse 69, height  5\' 6"  (1.676 m), weight 164 lb (74.4 kg). Body mass index is 26.47 kg/m. Gen:   Well NAD, A and O *3 HEENT:    [place]/AT, EOMI,  MMM Lungs:   Normal work of breathing. CTA B/L, no Wh, rhonchi Heart:   RRR, S1, S2 WNL's, no MRG Abd:   No gross distention Exts:    warm, pink,  Brisk capillary refill, warm and well perfused.  Psych:    No HI/SI, judgement and insight good, Euthymic mood. Full Affect.   Recent Results (from the past 2160 hour(s))  CBC with Differential/Platelet     Status: Abnormal   Collection Time: 05/04/17 11:37 AM  Result Value Ref Range   WBC 6.6 3.4 - 10.8 x10E3/uL   RBC 4.74 3.77 - 5.28 x10E6/uL   Hemoglobin 14.2 11.1 - 15.9 g/dL   Hematocrit 42.1 34.0 - 46.6 %   MCV 89 79 - 97 fL   MCH 30.0 26.6 - 33.0 pg   MCHC 33.7 31.5 - 35.7 g/dL   RDW 15.5 (H) 12.3 - 15.4 %   Platelets 487 (H) 150 - 379 x10E3/uL   Neutrophils 51 Not Estab. %   Lymphs 36 Not Estab. %   Monocytes 11 Not Estab. %   Eos 2 Not Estab. %   Basos 0 Not Estab. %   Neutrophils Absolute 3.3 1.4 - 7.0 x10E3/uL   Lymphocytes Absolute 2.4 0.7 - 3.1 x10E3/uL   Monocytes Absolute 0.7  0.1 - 0.9 x10E3/uL   EOS (ABSOLUTE) 0.2 0.0 - 0.4 x10E3/uL   Basophils Absolute 0.0 0.0 - 0.2 x10E3/uL   Immature Granulocytes 0 Not Estab. %   Immature Grans (Abs) 0.0 0.0 - 0.1 x10E3/uL  Comprehensive metabolic panel     Status: Abnormal   Collection Time: 05/04/17 11:37 AM  Result Value Ref Range   Glucose 91 65 - 99 mg/dL   BUN 10 8 - 27 mg/dL   Creatinine, Ser 0.63 0.57 - 1.00 mg/dL   GFR calc non Af Amer 97 >59 mL/min/1.73   GFR calc Af Amer 112 >59 mL/min/1.73   BUN/Creatinine Ratio 16 12 - 28   Sodium 140 134 - 144 mmol/L   Potassium 4.4 3.5 - 5.2 mmol/L   Chloride 97 96 - 106 mmol/L   CO2 26 20 - 29 mmol/L   Calcium 10.5 (H) 8.7 - 10.3 mg/dL   Total Protein 7.2 6.0 - 8.5 g/dL   Albumin 4.5 3.6 - 4.8 g/dL   Globulin, Total 2.7 1.5 - 4.5 g/dL   Albumin/Globulin Ratio 1.7 1.2 - 2.2   Bilirubin Total 0.6 0.0 - 1.2 mg/dL   Alkaline Phosphatase 113 39 - 117 IU/L   AST 43 (H) 0 - 40 IU/L   ALT 82 (H) 0 - 32 IU/L  Hemoglobin A1c     Status: Abnormal   Collection Time: 05/04/17 11:37 AM  Result Value Ref Range   Hgb A1c MFr Bld 6.2 (H) 4.8 - 5.6 %    Comment:          Prediabetes: 5.7 - 6.4          Diabetes: >6.4          Glycemic control for adults with diabetes: <7.0    Est. average glucose Bld gHb Est-mCnc 131 mg/dL  HIV antibody     Status: Abnormal   Collection Time: 05/04/17 11:37 AM  Result Value Ref Range   HIV Screen 4th Generation wRfx Reactive (A) Non Reactive  Comment: See additional algorithm testing elsewhere in this report.  Hepatitis C antibody     Status: None   Collection Time: 05/04/17 11:37 AM  Result Value Ref Range   Hep C Virus Ab <0.1 0.0 - 0.9 s/co ratio    Comment:                                   Negative:     < 0.8                              Indeterminate: 0.8 - 0.9                                   Positive:     > 0.9  The CDC recommends that a positive HCV antibody result  be followed up with a HCV Nucleic Acid Amplification  test  (409811).   Lipid panel     Status: Abnormal   Collection Time: 05/04/17 11:37 AM  Result Value Ref Range   Cholesterol, Total 260 (H) 100 - 199 mg/dL   Triglycerides 148 0 - 149 mg/dL   HDL 52 >39 mg/dL   VLDL Cholesterol Cal 30 5 - 40 mg/dL   LDL Calculated 178 (H) 0 - 99 mg/dL   Chol/HDL Ratio 5.0 (H) 0.0 - 4.4 ratio    Comment:                                   T. Chol/HDL Ratio                                             Men  Women                               1/2 Avg.Risk  3.4    3.3                                   Avg.Risk  5.0    4.4                                2X Avg.Risk  9.6    7.1                                3X Avg.Risk 23.4   11.0   VITAMIN D 25 Hydroxy (Vit-D Deficiency, Fractures)     Status: None   Collection Time: 05/04/17 11:37 AM  Result Value Ref Range   Vit D, 25-Hydroxy 40.7 30.0 - 100.0 ng/mL    Comment: Vitamin D deficiency has been defined by the [place] practice guideline as a level of serum 25-OH vitamin D less than 20 ng/mL (1,2). The Endocrine Society went on to further define vitamin D insufficiency as a level between 21 and 29 ng/mL (2). 1. IOM (Institute of  Medicine). 2010. Dietary reference    intakes for calcium and D. [place]: The    [employer]. 2. Holick MF, Binkley [place], Bischoff-Ferrari HA, et al.    Evaluation, treatment, and prevention of vitamin D    deficiency: an Endocrine Society clinical practice    guideline. JCEM. 2011 Jul; 96(7):1911-30.   TSH     Status: None   Collection Time: 05/04/17 11:37 AM  Result Value Ref Range   TSH 2.960 0.450 - 4.500 uIU/mL  T4, free     Status: None   Collection Time: 05/04/17 11:37 AM  Result Value Ref Range   Free T4 1.34 0.82 - 1.77 ng/dL  HIV 1/2 Ab Differentiation     Status: None   Collection Time: 05/04/17 11:37 AM  Result Value Ref Range   HIV 1 Ab Negative Negative   HIV 2 Ab Negative Negative   NOTE (HIV CONF MULTIP  Negative     Comment: See RNA Reflex.  RNA Qualitative     Status: None   Collection Time: 05/04/17 11:37 AM  Result Value Ref Range   HIV 1 RNA Qualitative Negative Negative    Comment: Negative for HIV-1 RNA   Final Interpretation Comment     Comment: HIV antibodies were not confirmed and HIV 1 RNA was not detected. No laboratory evidence of HIV 1 infection. Follow-up testing for HIV 2 should be performed if clinically indicated.   POCT UA - Microalbumin     Status: Normal   Collection Time: 05/04/17 11:54 AM  Result Value Ref Range   Microalbumin Ur, POC 10 mg/L   Creatinine, POC 100 mg/dL   Albumin/Creatinine Ratio, Urine, POC <30   HIV-1 DNA qualitative by PCR, blood     Status: None   Collection Time: 05/18/17  9:13 AM  Result Value Ref Range   HIV-1 RNA Qualitative Negative Negative    Comment: Negative for HIV-1 RNA  CBC and differential     Status: Abnormal   Collection Time: 07/02/17 12:00 AM  Result Value Ref Range   Hemoglobin 13.6 12.0 - 16.0   HCT 41 36 - 46   Platelets 41 (A) 150 - 399   WBC 5.2   POCT erythrocyte sed rate, Non-automated     Status: None   Collection Time: 07/02/17 12:00 AM  Result Value Ref Range   Sed Rate 17   Basic metabolic panel     Status: None   Collection Time: 07/02/17 12:00 AM  Result Value Ref Range   Glucose 89    BUN 13 4 - 21   Creatinine 0.6 0.5 - 1.1   Potassium 4.6 3.4 - 5.3   Sodium 141 137 - 147  Lipid panel     Status: Abnormal   Collection Time: 07/02/17 12:00 AM  Result Value Ref Range   LDl/HDL Ratio 2.6    Triglycerides 158 40 - 160   Cholesterol 237 (A) 0 - 200   HDL 32 (A) 35 - 70   LDL Cholesterol 148   Hepatic function panel     Status: None   Collection Time: 07/02/17 12:00 AM  Result Value Ref Range   Alkaline Phosphatase 95 25 - 125   Bilirubin, Total 1.5   Hemoglobin A1c     Status: None   Collection Time: 07/02/17 12:00 AM  Result Value Ref Range   Hemoglobin A1C 6.0

## 2017-05-26 NOTE — Patient Instructions (Signed)
Continue current medicine regimen we will recheck your A1c, fasting lipid profile, vitamin D, and CMP to include ALT and AST in 3-4 months.  Guidelines for a Low Cholesterol, Low Saturated Fat Diet   Fats - Limit total intake of fats and oils. - Avoid butter, stick margarine, shortening, lard, palm and coconut oils. - Limit mayonnaise, salad dressings, gravies and sauces, unless they are homemade with low-fat ingredients. - Limit chocolate. - Choose low-fat and nonfat products, such as low-fat mayonnaise, low-fat or non-hydrogenated peanut butter, low-fat or fat-free salad dressings and nonfat gravy. - Use vegetable oil, such as canola or olive oil. - Look for margarine that does not contain trans fatty acids. - Use nuts in moderate amounts. - Read ingredient labels carefully to determine both amount and type of fat present in foods. Limit saturated and trans fats! - Avoid high-fat processed and convenience foods.  Meats and Meat Alternatives - Choose fish, chicken, [place] and lean meats. - Use dried beans, peas, lentils and tofu. - Limit egg yolks to three to four per week. - If you eat red meat, limit to no more than three servings per week and choose loin or round cuts. - Avoid fatty meats, such as bacon, sausage, franks, luncheon meats and ribs. - Avoid all organ meats, including liver.  Dairy - Choose nonfat or low-fat milk, yogurt and cottage cheese. - Most cheeses are high in fat. Choose cheeses made from non-fat milk, such as mozzarella and ricotta cheese. - Choose light or fat-free cream cheese and sour cream. - Avoid cream and sauces made with cream.  Fruits and Vegetables - Eat a wide variety of fruits and vegetables. - Use lemon juice, vinegar or "mist" olive oil on vegetables. - Avoid adding sauces, fat or oil to vegetables.  Breads, Cereals and Grains - Choose whole-grain breads, cereals, pastas and rice. - Avoid high-fat snack foods, such as granola, cookies,  pies, pastries, doughnuts and croissants.  Cooking Tips - Avoid deep fried foods. - Trim visible fat off meats and remove skin from poultry before cooking. - Bake, broil, boil, poach or roast poultry, fish and lean meats. - Drain and discard fat that drains out of meat as you cook it. - Add little or no fat to foods. - Use vegetable oil sprays to grease pans for cooking or baking. - Steam vegetables. - Use herbs or no-oil marinades to flavor foods.   Nine ways to increase your "good" HDL cholesterol  High-density lipoprotein (HDL) is often referred to as the "good" cholesterol. Having high HDL levels helps carry cholesterol from your arteries to your liver, where it can be used or excreted.  Having high levels of HDL also has antioxidant and anti-inflammatory effects, and is linked to a reduced risk of heart disease (1, 2).  Most health experts recommend minimum blood levels of 40 mg/dl in men and 50 mg/dl in women.  While genetics definitely play a role, there are several other factors that affect HDL levels.  Here are nine healthy ways to raise your "good" HDL cholesterol.  1. Consume olive oil  two pieces of salmon on a plate olive oil being poured into a small dish Extra virgin olive oil may be more healthful than processed olive oils. Olive oil is one of the healthiest fats around.  A large analysis of 42 studies with more than 800,000 participants found that olive oil was the only source of monounsaturated fat that seemed to reduce heart disease risk (3).  Research  has shown that one of olive oil's heart-healthy effects is an increase in HDL cholesterol. This effect is thought to be caused by antioxidants it contains called polyphenols (4, 5, 6, 7).  Extra virgin olive oil has more polyphenols than more processed olive oils, although the amount can still vary among different types and brands.  One study gave 200 healthy young men about 2 tablespoons (25 ml) of different  olive oils per day for three weeks.  The researchers found that participants' HDL levels increased significantly more after they consumed the olive oil with the highest polyphenol content (6).  In another study, when 22 older adults consumed about 4 tablespoons (50 ml) of high-polyphenol extra virgin olive oil every day for six weeks, their HDL cholesterol increased by 6.5 mg/dl, on average (7).  In addition to raising HDL levels, olive oil has been found to boost HDL's anti-inflammatory and antioxidant function in studies of older people and individuals with high cholesterol levels ( 7, 8, 9).  Whenever possible, select high-quality, certified extra virgin olive oils, which tend to be highest in polyphenols.  Bottom line: Extra virgin olive oil with a high polyphenol content has been shown to increase HDL levels in healthy people, the elderly and individuals with high cholesterol.  2. Follow a low-carb or ketogenic diet  Low-carb and ketogenic diets provide a number of health benefits, including weight loss and reduced blood sugar levels.  They have also been shown to increase HDL cholesterol in people who tend to have lower levels.  This includes those who are obese, insulin resistant or diabetic (10, 11, 12, 13, 14, 15, 16, 17).  In one study, people with type 2 diabetes were split into two groups.  One followed a diet consuming less than 50 grams of carbs per day. The other followed a high-carb diet.  Although both groups lost weight, the low-carb group's HDL cholesterol increased almost twice as much as the high-carb group's did (14).  In another study, obese people who followed a low-carb diet experienced an increase in HDL cholesterol of 5 mg/dl overall.  Meanwhile, in the same study, the participants who ate a low-fat, high-carb diet showed a decrease in HDL cholesterol (15).  This response may partially be due to the higher levels of fat people typically consume on low-carb  diets.  One study in overweight women found that diets high in meat and cheese increased HDL levels by 5-8%, compared to a higher-carb diet (18).  What's more, in addition to raising HDL cholesterol, very-low-carb diets have been shown to decrease triglycerides and improve several other risk factors for heart disease (13, 14, 16, 17).  Bottom line: Low-carb and ketogenic diets typically increase HDL cholesterol levels in people with diabetes, metabolic syndrome and obesity.  3. Exercise regularly  Being physically active is important for heart health.  Studies have shown that many different types of exercise are effective at raising HDL cholesterol, including strength training, high-intensity exercise and aerobic exercise (19, 20, 21, 22, 23, 24).  However, the biggest increases in HDL are typically seen with high-intensity exercise.  One small study followed women who were living with polycystic ovary syndrome (PCOS), which is linked to a higher risk of insulin resistance. The study required them to perform high-intensity exercise three times a week.  The exercise led to an increase in HDL cholesterol of 8 mg/dL after 10 weeks. The women also showed improvements in other health markers, including decreased insulin resistance and improved arterial function (23).  In a 12-week study, overweight men who performed high-intensity exercise experienced a 10% increase in HDL cholesterol.  In contrast, the low-intensity exercise group showed only a 2% increase and the endurance training group experienced no change (24).  However, even lower-intensity exercise seems to increase HDL's anti-inflammatory and antioxidant capacities, whether or not HDL levels change (20, 21, 25).  Overall, high-intensity exercise such as high-intensity interval training (HIIT) and high-intensity circuit training (HICT) may boost HDL cholesterol levels the most.  Bottom line: Exercising several times per week can help  raise HDL cholesterol and enhance its anti-inflammatory and antioxidant effects. High-intensity forms of exercise may be especially effective.  4. Add coconut oil to your diet  Studies have shown that coconut oil may reduce appetite, increase metabolic rate and help protect brain health, among other benefits.  Some people may be concerned about coconut oil's effects on heart health due to its high saturated fat content.  However, it appears that coconut oil is actually quite heart healthy.  Coconut oil tends to raise HDL cholesterol more than many other types of fat.  In addition, it may improve the ratio of low-density-lipoprotein (LDL) cholesterol, the "bad" cholesterol, to HDL cholesterol. Improving this ratio reduces heart disease risk (26, 27, 28, 29).  One study examined the health effects of coconut oil on 46 women with excess belly fat. The researchers found that participants who took coconut oil daily experienced increased HDL cholesterol and a lower LDL-to-HDL ratio.  In contrast, the group who took soybean oil daily had a decrease in HDL cholesterol and an increase in the LDL-to-HDL ratio (29).  Most studies have found these health benefits occur at a dosage of about 2 tablespoons (30 ml) of coconut oil per day. It's best to incorporate this into cooking rather than eating spoonfuls of coconut oil on their own.  Bottom line: Consuming 2 tablespoons (30 ml) of coconut oil per day may help increase HDL cholesterol levels.  5. Stop smoking  cigarette butt Quitting smoking can reduce the risk of heart disease and lung cancer. Smoking increases the risk of many health problems, including heart disease and lung cancer (30).  One of its negative effects is a suppression of HDL cholesterol.  Some studies have found that quitting smoking can increase HDL levels. Indeed, one study found no significant differences in HDL levels between former smokers and people who had never smoked (31,  32, 33, 34, 35).  In a one-year study of more than 1,500 people, those who quit smoking had twice the increase in HDL as those who resumed smoking within the year. The number of large HDL particles also increased, which further reduced heart disease risk (32).  One study followed smokers who switched from traditional cigarettes to electronic cigarettes for one year. They found that the switch was associated with an increase in HDL cholesterol of 5 mg/dl, on average (33).  When it comes to the effect of nicotine replacement patches on HDL levels, research results have been mixed.  One study found that nicotine replacement therapy led to higher HDL cholesterol. However, other research suggests that people who use nicotine patches likely won't see increases in HDL levels until after replacement therapy is completed (34, 36).  Even in studies where HDL cholesterol levels didn't increase after people quit smoking, HDL function improved, resulting in less inflammation and other beneficial effects on heart health (37).  Bottom line: Quitting smoking can increase HDL levels, improve HDL function and help protect heart health.  6. Lose weight  When overweight and obese people lose weight, their HDL cholesterol levels usually increase.  What's more, this benefit seems to occur whether weight loss is achieved by calorie counting, carb restriction, intermittent fasting, weight loss surgery or a combination of diet and exercise (16, 38, 39, 40, 41, 42).  One study examined HDL levels in more than 3,000 overweight and obese [place] adults who followed a lifestyle modification program for one year.  The researchers found that losing at least 6.6 lbs (3 kg) led to an increase in HDL cholesterol of 4 mg/dl, on average (41).  In another study, when obese people with type 2 diabetes consumed calorie-restricted diets that provided 20-30% of calories from protein, they experienced significant increases in HDL  cholesterol levels (42).  The key to achieving and maintaining healthy HDL cholesterol levels is choosing the type of diet that makes it easiest for you to lose weight and keep it off.  Bottom Line: Several methods of weight loss have been shown to increase HDL cholesterol levels in people who are overweight or obese.  7. Choose purple produce  Consuming purple-colored fruits and vegetables is a delicious way to potentially increase HDL cholesterol.  Purple produce contains antioxidants known as anthocyanins.  Studies using anthocyanin extracts have shown that they help fight inflammation, protect your cells from damaging free radicals and may also raise HDL cholesterol levels (43, 44, 45, 46).  In a 24-week study of 38 people with diabetes, those who took an anthocyanin supplement twice a day experienced a 19% increase in HDL cholesterol, on average, along with other improvements in heart health markers (45).  In another study, when people with cholesterol issues took anthocyanin extract for 12 weeks, their HDL cholesterol levels increased by 13.7% (46).  Although these studies used extracts instead of foods, there are several fruits and vegetables that are very high in anthocyanins. These include eggplant, purple corn, red cabbage, blueberries, blackberries and black raspberries.  Bottom line: Consuming fruits and vegetables rich in anthocyanins may help increase HDL cholesterol levels.  8. Eat fatty fish often  The omega-3 fats in fatty fish provide major benefits to heart health, including a reduction in inflammation and better functioning of the cells that line your arteries (47, 48).  There's some research showing that eating fatty fish or taking fish oil may also help raise low levels of HDL cholesterol (49, 50, 51, 52, 53).  In a study of 33 heart disease patients, participants that consumed fatty fish four times per week experienced an increase in HDL cholesterol levels. The  particle size of their HDL also increased (52).  In another study, overweight men who consumed herring five days a week for six weeks had a 5% increase in HDL cholesterol, compared with their levels after eating lean pork and chicken five days a week (53).  However, there are a few studies that found no increase in HDL cholesterol in response to increased fish or omega-3 supplement intake (54, 55).  In addition to herring, other types of fatty fish that may help raise HDL cholesterol include salmon, sardines, mackerel and anchovies.  Bottom line: Eating fatty fish several times per week may help increase HDL cholesterol levels and provide other benefits to heart health.  9. Avoid artificial trans fats  Artificial trans fats have many negative health effects due to their inflammatory properties (56, 57).  There are two types of trans fats. One kind occurs naturally in animal products, including full-fat dairy.  In contrast, the artificial trans fats found in margarines and processed foods are created by adding hydrogen to unsaturated vegetable and seed oils. These fats are also known as industrial trans fats or partially hydrogenated fats.  Research has shown that, in addition to increasing inflammation and contributing to several health problems, these artificial trans fats may lower HDL cholesterol levels.  In one study, researchers compared how people's HDL levels responded when they consumed different margarines.  The study found that participants' HDL cholesterol levels were 10% lower after consuming margarine containing partially hydrogenated soybean oil, compared to their levels after consuming palm oil (58).  Another controlled study followed 40 adults who had diets high in different types of trans fats.  They found that HDL cholesterol levels in women were significantly lower after they consumed the diet high in industrial trans fats, compared to the diet containing naturally  occurring trans fats (59).  To protect heart health and keep HDL cholesterol in the healthy range, it's best to avoid artificial trans fats altogether.  Bottom line: Artificial trans fats have been shown to lower HDL levels and increase inflammation, compared to other fats.  Take home message  Although your HDL cholesterol levels are partly determined by your genetics, there are many things you can do to naturally increase your own levels.  Fortunately, the practices that raise HDL cholesterol often provide other health benefits as well.

## 2017-05-29 MED ORDER — FLUTICASONE PROPIONATE 50 MCG/ACT NA SUSP: 2.0000 | Freq: Every day | NASAL | 1 refills | Status: AC

## 2017-06-01 DIAGNOSIS — F331 Major depressive disorder, recurrent, moderate: Secondary | ICD-10-CM | POA: Diagnosis not present

## 2017-07-02 DIAGNOSIS — Z79899 Other long term (current) drug therapy: Secondary | ICD-10-CM | POA: Diagnosis not present

## 2017-07-02 DIAGNOSIS — M0579 Rheumatoid arthritis with rheumatoid factor of multiple sites without organ or systems involvement: Secondary | ICD-10-CM | POA: Diagnosis not present

## 2017-07-02 LAB — BASIC METABOLIC PANEL
BUN: 13 (ref 4–21)
BUN: 13 (ref 4–21)
Creatinine: 0.6 (ref 0.5–1.1)
Creatinine: 0.6 (ref <=1.1)
Glucose: 89
Glucose: 89
Potassium: 4.6 (ref 3.4–5.3)
Potassium: 4.6 (ref 3.4–5.3)
Sodium: 141 (ref 137–147)
Sodium: 141 (ref 137–147)

## 2017-07-02 LAB — LIPID PANEL
Cholesterol: 237 — AB (ref 0–200)
HDL: 32 — AB (ref 35–70)
LDL Cholesterol: 148
LDl/HDL Ratio: 2.6
Triglycerides: 158 (ref 40–160)

## 2017-07-02 LAB — HEPATIC FUNCTION PANEL
Alkaline Phosphatase: 95 (ref 25–125)
Bilirubin, Total: 1.5

## 2017-07-02 LAB — CBC AND DIFFERENTIAL
HCT: 41 (ref 36–46)
HCT: 41 (ref 36–46)
Hemoglobin: 13.6 (ref 12.0–16.0)
Hemoglobin: 13.6 (ref 12.0–16.0)
Platelets: 355 (ref 150–399)
Platelets: 41 — AB (ref 150–399)
WBC: 5.2
WBC: 5.2

## 2017-07-02 LAB — HEMOGLOBIN A1C: Hemoglobin A1C: 6

## 2017-07-02 LAB — POCT ERYTHROCYTE SEDIMENTATION RATE, NON-AUTOMATED: Sed Rate: 17

## 2017-07-07 DIAGNOSIS — M79643 Pain in unspecified hand: Secondary | ICD-10-CM | POA: Diagnosis not present

## 2017-07-07 DIAGNOSIS — M8580 Other specified disorders of bone density and structure, unspecified site: Secondary | ICD-10-CM | POA: Diagnosis not present

## 2017-07-07 DIAGNOSIS — G4700 Insomnia, unspecified: Secondary | ICD-10-CM | POA: Diagnosis not present

## 2017-07-07 DIAGNOSIS — Z79899 Other long term (current) drug therapy: Secondary | ICD-10-CM | POA: Diagnosis not present

## 2017-07-07 DIAGNOSIS — M0579 Rheumatoid arthritis with rheumatoid factor of multiple sites without organ or systems involvement: Secondary | ICD-10-CM | POA: Diagnosis not present

## 2017-07-08 DIAGNOSIS — M0570 Rheumatoid arthritis with rheumatoid factor of unspecified site without organ or systems involvement: Secondary | ICD-10-CM | POA: Diagnosis not present

## 2017-07-08 DIAGNOSIS — H2513 Age-related nuclear cataract, bilateral: Secondary | ICD-10-CM | POA: Diagnosis not present

## 2017-07-08 DIAGNOSIS — E119 Type 2 diabetes mellitus without complications: Secondary | ICD-10-CM | POA: Diagnosis not present

## 2017-07-11 ENCOUNTER — Other Ambulatory Visit: Payer: Self-pay | Admitting: Family Medicine

## 2017-07-11 DIAGNOSIS — F418 Other specified anxiety disorders: Secondary | ICD-10-CM

## 2017-07-17 ENCOUNTER — Encounter: Payer: Self-pay | Admitting: Family Medicine

## 2017-07-22 ENCOUNTER — Encounter: Payer: Self-pay | Admitting: Family Medicine

## 2017-07-22 ENCOUNTER — Ambulatory Visit (INDEPENDENT_AMBULATORY_CARE_PROVIDER_SITE_OTHER): Payer: BLUE CROSS/BLUE SHIELD | Admitting: Family Medicine

## 2017-07-22 VITALS — BP 110/68 | HR 85 | Ht 66.0 in | Wt 166.1 lb

## 2017-07-22 DIAGNOSIS — E119 Type 2 diabetes mellitus without complications: Secondary | ICD-10-CM

## 2017-07-22 DIAGNOSIS — M05711 Rheumatoid arthritis with rheumatoid factor of right shoulder without organ or systems involvement: Secondary | ICD-10-CM

## 2017-07-22 DIAGNOSIS — E559 Vitamin D deficiency, unspecified: Secondary | ICD-10-CM

## 2017-07-22 DIAGNOSIS — Z7184 Encounter for health counseling related to travel: Secondary | ICD-10-CM

## 2017-07-22 DIAGNOSIS — E1169 Type 2 diabetes mellitus with other specified complication: Secondary | ICD-10-CM | POA: Diagnosis not present

## 2017-07-22 DIAGNOSIS — E785 Hyperlipidemia, unspecified: Secondary | ICD-10-CM

## 2017-07-22 DIAGNOSIS — E118 Type 2 diabetes mellitus with unspecified complications: Secondary | ICD-10-CM

## 2017-07-22 DIAGNOSIS — I7300 Raynaud's syndrome without gangrene: Secondary | ICD-10-CM | POA: Diagnosis not present

## 2017-07-22 DIAGNOSIS — Z7189 Other specified counseling: Secondary | ICD-10-CM

## 2017-07-22 DIAGNOSIS — F418 Other specified anxiety disorders: Secondary | ICD-10-CM | POA: Diagnosis not present

## 2017-07-22 DIAGNOSIS — D696 Thrombocytopenia, unspecified: Secondary | ICD-10-CM | POA: Diagnosis not present

## 2017-07-22 MED ORDER — VITAMIN D (ERGOCALCIFEROL) 1.25 MG (50000 UNIT) PO CAPS: ORAL_CAPSULE | ORAL | 0 refills | Status: DC

## 2017-07-22 MED ORDER — FLUOXETINE HCL 20 MG PO CAPS: ORAL_CAPSULE | ORAL | 0 refills | Status: DC

## 2017-07-22 MED ORDER — FLUOXETINE HCL 20 MG PO CAPS: ORAL_CAPSULE | ORAL | 0 refills | Status: AC

## 2017-07-22 MED ORDER — CIPROFLOXACIN HCL 500 MG PO TABS: 500.0000 mg | ORAL_TABLET | Freq: Two times a day (BID) | ORAL | 0 refills | Status: AC

## 2017-07-22 NOTE — Progress Notes (Signed)
Assessment and plan:  1. Type 2 diabetes mellitus with complication, without long-term current use of insulin ([place])   2. Hyperlipidemia associated with type 2 diabetes mellitus ([place])   3. Diabetes mellitus type 2 in nonobese (HCC)   4. Depression with anxiety   5. Hyperlipidemia with target LDL less than 100   6. 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 Vitamin D deficiency   8. Temporary low platelet count (HCC)   9. Raynaud's disease without gangrene   10. Travel advice encounter    1. We will write for a glucometer, which would be best if specific to her insurance. We advised the patient to monitor her blood sugars and continue to eat a diabetic friendly diet.  2. Recently, her LDL improved while her HDL went down. We will need to get repeat labs today.  She will stay off of her Lipitor for now and return in 6 months for a check up and re-evaluation of her Lipitor.  3. She will need a refill of Prozac to last the duration of her time in [place], which could be up to 6 months. She will stay at her current dose of 40mg  daily.  6. Continue medications as prescribed. We will follow up with prior Vitamin D labs.  Since the patient will be in [place] for 4-6 months, will prescribe Cipro for 14 days in the case of GI issues.  Her recent bloodwork came back with alarmingly low platelet count. We will re-check her CBC to verify this today. She will return for fasting bloodwork in 6 months.   Education and routine counseling performed. Handouts provided.   Return in about 6 months (around 01/19/2018) for Follow-up in 6 months or sooner for diabetes, recheck full set of fasting .   Anticipatory guidance and routine counseling done re: condition, txmnt options and need for follow up. All questions of patient's were answered.   Gross side effects, risk and benefits, and  alternatives of medications discussed with patient.  Patient is aware that all medications have potential side effects and we are unable to predict every sideeffect or drug-drug interaction that may occur.  Expresses verbal understanding and consents to current therapy plan and treatment regiment.  Please see AVS handed out to patient at the end of our visit for additional patient instructions/ counseling done pertaining to today's office visit.  Note: This document was prepared using Dragon voice recognition software and may include unintentional dictation errors.  This document serves as a record of services personally performed by Mellody Dance, DO. It was created on her behalf by Toni Amend, a trained medical scribe. The creation of this record is based on the scribe's personal observations and the provider's statements to them.   I have reviewed the above documentation for accuracy and completeness, and I agree with the above.   Mellody Dance 07/22/17 3:28 PM   ----------------------------------------------------------------------------------------------------------------------  Subjective:   CC:   Michele Adkins is a 62 y.o. female who presents to [place] at [facility] today for review and discussion of recent bloodwork that was done. She is preparing to go to [place] on [place] work, as she does twice a year. She will be gone for four or more months and needs refills on her medicine  1. Available recent blood work was reviewed with patient today. Not all blood work was available. Patient was counseled on all abnormalities and we discussed  dietary and lifestyle changes that could help those values (also medications when appropriate).  Extensive health counseling performed and all patient's concerns/ questions were addressed.   Vitamin D Deficiency - The patient notes that her current dose of Prozac is still working well. She continues to take Vitamin  D.  She notes that she still has plenty of energy.   Diet-controlled Diabetes Mellitus - Her A1c was 6.0 last time. She reports that she would like to get her A1c down to 5.4.   - During her time abroad, she notes that she will monitor her DM, walk more, and eat less sugar. She comments that the food in [place] is mainly rice and noodles, so she will need to be observant.  Hyperlipidemia  - She went off of Lipitor during her previous time in [place] and is currently still not taking it.  Patient makes mention of Raynaud's today and notes paleness of her fingers, this is news to me.  Wt Readings from Last 3 Encounters:  07/22/17 166 lb 1.6 oz (75.3 kg)  05/26/17 164 lb (74.4 kg)  05/04/17 162 lb 8 oz (73.7 kg)   BP Readings from Last 3 Encounters:  07/22/17 110/68  05/26/17 128/80  05/04/17 122/84   Pulse Readings from Last 3 Encounters:  07/22/17 85  05/26/17 69  05/04/17 76   BMI Readings from Last 3 Encounters:  07/22/17 26.81 kg/m  05/26/17 26.47 kg/m  05/04/17 26.23 kg/m     Patient Care Team    Relationship Specialty Notifications Start End  Mellody Dance, DO PCP - General Family Medicine  04/03/16   Valinda Party, MD  Rheumatology  04/03/16   Calvert Cantor, MD Consulting Physician Ophthalmology  04/03/16   Gatha Mayer, MD Consulting Physician Gastroenterology  04/05/16   Marius Ditch, MD Attending Physician Internal Medicine  05/04/17    Comment: Sleep counseling; insomnia--  OSA testing -    Full medical history updated and reviewed in the office today  Patient Active Problem List   Diagnosis Date Noted  . Hyperlipidemia associated with type 2 diabetes mellitus ([place]) 05/04/2017    Priority: High  . Type 2 diabetes mellitus with complication, without long-term current use of insulin ([place]) 03/01/2012    Priority: High  . Allergic rhinitis due to pollen 05/29/2014    Priority: Low  . Insomnia due to anxiety and fear 01/26/2014    Priority:  Low  . Temporary low platelet count ([place]) 07/22/2017  . Raynauds syndrome 07/22/2017  . Vitamin D deficiency 05/04/2017  . Abnormal SPEP 05/02/2016  . 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 Elevated blood pressure 04/17/2016  . 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 Elevated LFTs 07/11/2015  . Menopausal vaginal dryness 01/26/2014  . Depression with anxiety 03/11/2013  . Routine general medical examination at a health care facility 03/01/2012  . Hyperlipidemia with target LDL less than 100 03/01/2012  . Osteopenia 03/01/2012  . Rheumatoid arthritis ([place]) 03/01/2012  . Personal history of colonic polyps- per patient recall approx 2007 03/01/2012    Past Medical History:  Diagnosis Date  . Allergy   . Depression   . Diabetes mellitus without complication ([place])   . Hyperlipidemia   . Osteopenia 2007  . Rheumatoid arthritis(714.0) 2001  . Sleep disorder     Past Surgical History:  Procedure Laterality Date  . TONSILLECTOMY      Social History   Tobacco Use  . Smoking status: Never Smoker  . Smokeless tobacco:  Never Used  Substance Use Topics  . Alcohol use: No    Family Hx: Family History  Problem Relation Age of Onset  . Diabetes Mother   . Hypertension Father   . Diabetes Brother   . Colon cancer Maternal Grandfather 44  . Diabetes Sister   . Cancer Sister        breast ca  . Alcohol abuse Neg Hx   . Depression Neg Hx   . Early death Neg Hx   . Heart disease Neg Hx   . Hyperlipidemia Neg Hx   . Kidney disease Neg Hx   . Stroke Neg Hx     Medications:  Current Outpatient Medications:  .  aspirin 81 MG tablet, Take 81 mg by mouth daily., Disp: , Rfl:  .  conjugated estrogens (PREMARIN) vaginal cream, Place 1 Applicatorful vaginally at bedtime., Disp: 30 g, Rfl: 11 .  Eszopiclone 3 MG TABS, , Disp: , Rfl: 1 .  FLUoxetine (PROZAC) 20 MG capsule, 2 po qd, Disp: 180 capsule, Rfl: 0 .  fluticasone (FLONASE) 50 MCG/ACT nasal  spray, Place 2 sprays daily into both nostrils., Disp: 16 g, Rfl: 1 .  folic acid (FOLVITE) 1 MG tablet, Take 1 mg by mouth daily. , Disp: , Rfl: 3 .  methotrexate 250 MG/10ML injection, Inject 60 mg into the skin once a week. , Disp: , Rfl: 1 .  Probiotic Product (PROBIOTIC-10 PO), Take 1 tablet by mouth daily., Disp: , Rfl:  .  TURMERIC PO, Take 120 mg by mouth 2 (two) times daily., Disp: , Rfl:  .  Vitamin D, Ergocalciferol, (DRISDOL) 50000 units CAPS capsule, Take one tablet wkly, Disp: 12 capsule, Rfl: 0 .  ciprofloxacin (CIPRO) 500 MG tablet, Take 1 tablet (500 mg total) by mouth 2 (two) times daily., Disp: 28 tablet, Rfl: 0    Allergies:  Allergies  Allergen Reactions  . Sulfa Drugs Cross Reactors Hives    Review of Systems: General:   No F/C, wt loss Pulm:   No DIB, SOB, pleuritic chest pain Card:  No CP, palpitations Abd:  No n/v/d or pain Ext:  No inc edema from baseline   Objective:  Blood pressure 110/68, pulse 85, height 5\' 6"  (1.676 m), weight 166 lb 1.6 oz (75.3 kg), SpO2 95 %. Body mass index is 26.81 kg/m. Gen:   Well NAD, A and O *3 HEENT:    [place]/AT, EOMI,  MMM Lungs:   Normal work of breathing. CTA B/L, no Wh, rhonchi Heart:   RRR, S1, S2 WNL's, no MRG Abd:   No gross distention Exts:    warm, pink,  Brisk capillary refill, warm and well perfused.  Psych:    No HI/SI, judgement and insight good, Euthymic mood. Full Affect.   Recent Results (from the past 2160 hour(s))  CBC with Differential/Platelet     Status: Abnormal   Collection Time: 05/04/17 11:37 AM  Result Value Ref Range   WBC 6.6 3.4 - 10.8 x10E3/uL   RBC 4.74 3.77 - 5.28 x10E6/uL   Hemoglobin 14.2 11.1 - 15.9 g/dL   Hematocrit 42.1 34.0 - 46.6 %   MCV 89 79 - 97 fL   MCH 30.0 26.6 - 33.0 pg   MCHC 33.7 31.5 - 35.7 g/dL   RDW 15.5 (H) 12.3 - 15.4 %   Platelets 487 (H) 150 - 379 x10E3/uL   Neutrophils 51 Not Estab. %   Lymphs 36 Not Estab. %   Monocytes 11 Not Estab. %  Eos 2 Not Estab.  %   Basos 0 Not Estab. %   Neutrophils Absolute 3.3 1.4 - 7.0 x10E3/uL   Lymphocytes Absolute 2.4 0.7 - 3.1 x10E3/uL   Monocytes Absolute 0.7 0.1 - 0.9 x10E3/uL   EOS (ABSOLUTE) 0.2 0.0 - 0.4 x10E3/uL   Basophils Absolute 0.0 0.0 - 0.2 x10E3/uL   Immature Granulocytes 0 Not Estab. %   Immature Grans (Abs) 0.0 0.0 - 0.1 x10E3/uL  Comprehensive metabolic panel     Status: Abnormal   Collection Time: 05/04/17 11:37 AM  Result Value Ref Range   Glucose 91 65 - 99 mg/dL   BUN 10 8 - 27 mg/dL   Creatinine, Ser 0.63 0.57 - 1.00 mg/dL   GFR calc non Af Amer 97 >59 mL/min/1.73   GFR calc Af Amer 112 >59 mL/min/1.73   BUN/Creatinine Ratio 16 12 - 28   Sodium 140 134 - 144 mmol/L   Potassium 4.4 3.5 - 5.2 mmol/L   Chloride 97 96 - 106 mmol/L   CO2 26 20 - 29 mmol/L   Calcium 10.5 (H) 8.7 - 10.3 mg/dL   Total Protein 7.2 6.0 - 8.5 g/dL   Albumin 4.5 3.6 - 4.8 g/dL   Globulin, Total 2.7 1.5 - 4.5 g/dL   Albumin/Globulin Ratio 1.7 1.2 - 2.2   Bilirubin Total 0.6 0.0 - 1.2 mg/dL   Alkaline Phosphatase 113 39 - 117 IU/L   AST 43 (H) 0 - 40 IU/L   ALT 82 (H) 0 - 32 IU/L  Hemoglobin A1c     Status: Abnormal   Collection Time: 05/04/17 11:37 AM  Result Value Ref Range   Hgb A1c MFr Bld 6.2 (H) 4.8 - 5.6 %    Comment:          Prediabetes: 5.7 - 6.4          Diabetes: >6.4          Glycemic control for adults with diabetes: <7.0    Est. average glucose Bld gHb Est-mCnc 131 mg/dL  HIV antibody     Status: Abnormal   Collection Time: 05/04/17 11:37 AM  Result Value Ref Range   HIV Screen 4th Generation wRfx Reactive (A) Non Reactive    Comment: See additional algorithm testing elsewhere in this report.  Hepatitis C antibody     Status: None   Collection Time: 05/04/17 11:37 AM  Result Value Ref Range   Hep C Virus Ab <0.1 0.0 - 0.9 s/co ratio    Comment:                                   Negative:     < 0.8                              Indeterminate: 0.8 - 0.9                                    Positive:     > 0.9  The CDC recommends that a positive HCV antibody result  be followed up with a HCV Nucleic Acid Amplification  test (258527).   Lipid panel     Status: Abnormal   Collection Time: 05/04/17 11:37 AM  Result Value Ref Range   Cholesterol, Total 260 (H) 100 -  199 mg/dL   Triglycerides 148 0 - 149 mg/dL   HDL 52 >39 mg/dL   VLDL Cholesterol Cal 30 5 - 40 mg/dL   LDL Calculated 178 (H) 0 - 99 mg/dL   Chol/HDL Ratio 5.0 (H) 0.0 - 4.4 ratio    Comment:                                   T. Chol/HDL Ratio                                             Men  Women                               1/2 Avg.Risk  3.4    3.3                                   Avg.Risk  5.0    4.4                                2X Avg.Risk  9.6    7.1                                3X Avg.Risk 23.4   11.0   VITAMIN D 25 Hydroxy (Vit-D Deficiency, Fractures)     Status: None   Collection Time: 05/04/17 11:37 AM  Result Value Ref Range   Vit D, 25-Hydroxy 40.7 30.0 - 100.0 ng/mL    Comment: Vitamin D deficiency has been defined by the [place] practice guideline as a level of serum 25-OH vitamin D less than 20 ng/mL (1,2). The Endocrine Society went on to further define vitamin D insufficiency as a level between 21 and 29 ng/mL (2). 1. IOM (Institute of Medicine). 2010. Dietary reference    intakes for calcium and D. [place]: The    [employer]. 2. Holick MF, [place], Bischoff-Ferrari HA, et al.    Evaluation, treatment, and prevention of vitamin D    deficiency: an Endocrine Society clinical practice    guideline. JCEM. 2011 Jul; 96(7):1911-30.   TSH     Status: None   Collection Time: 05/04/17 11:37 AM  Result Value Ref Range   TSH 2.960 0.450 - 4.500 uIU/mL  T4, free     Status: None   Collection Time: 05/04/17 11:37 AM  Result Value Ref Range   Free T4 1.34 0.82 - 1.77 ng/dL  HIV 1/2 Ab Differentiation     Status: None    Collection Time: 05/04/17 11:37 AM  Result Value Ref Range   HIV 1 Ab Negative Negative   HIV 2 Ab Negative Negative   NOTE (HIV CONF MULTIP Negative     Comment: See RNA Reflex.  RNA Qualitative     Status: None   Collection Time: 05/04/17 11:37 AM  Result Value Ref Range   HIV 1 RNA Qualitative Negative Negative    Comment: Negative for HIV-1 RNA   Final Interpretation Comment     Comment:  HIV antibodies were not confirmed and HIV 1 RNA was not detected. No laboratory evidence of HIV 1 infection. Follow-up testing for HIV 2 should be performed if clinically indicated.   POCT UA - Microalbumin     Status: Normal   Collection Time: 05/04/17 11:54 AM  Result Value Ref Range   Microalbumin Ur, POC 10 mg/L   Creatinine, POC 100 mg/dL   Albumin/Creatinine Ratio, Urine, POC <30   HIV-1 DNA qualitative by PCR, blood     Status: None   Collection Time: 05/18/17  9:13 AM  Result Value Ref Range   HIV-1 RNA Qualitative Negative Negative    Comment: Negative for HIV-1 RNA  CBC and differential     Status: Abnormal   Collection Time: 07/02/17 12:00 AM  Result Value Ref Range   Hemoglobin 13.6 12.0 - 16.0   HCT 41 36 - 46   Platelets 41 (A) 150 - 399   WBC 5.2   POCT erythrocyte sed rate, Non-automated     Status: None   Collection Time: 07/02/17 12:00 AM  Result Value Ref Range   Sed Rate 17   Basic metabolic panel     Status: None   Collection Time: 07/02/17 12:00 AM  Result Value Ref Range   Glucose 89    BUN 13 4 - 21   Creatinine 0.6 0.5 - 1.1   Potassium 4.6 3.4 - 5.3   Sodium 141 137 - 147  Lipid panel     Status: Abnormal   Collection Time: 07/02/17 12:00 AM  Result Value Ref Range   LDl/HDL Ratio 2.6    Triglycerides 158 40 - 160   Cholesterol 237 (A) 0 - 200   HDL 32 (A) 35 - 70   LDL Cholesterol 148   Hepatic function panel     Status: None   Collection Time: 07/02/17 12:00 AM  Result Value Ref Range   Alkaline Phosphatase 95 25 - 125   Bilirubin, Total 1.5     Hemoglobin A1c     Status: None   Collection Time: 07/02/17 12:00 AM  Result Value Ref Range   Hemoglobin A1C 6.0

## 2017-07-22 NOTE — Patient Instructions (Addendum)
Please do not take the ciprofloxacin until you hear from [place] that your platelets are back within normal limits.  This can have deadly consequences if you take it with platelets of 41.  Please call your medical insurance and find out which glucometer they prefer.  Then call and let 1 of the CMA is no here and they can send in a prescription for the glucometer and appropriate supplies for you.  Please check fasting as well as 2-hour postprandial and write it down.  Bring in his log next office visit.  I recommend you get your A1c checked every 4-6 months as long as it stays 6 or under.  If it worsens we may need more frequent labs  Please plan on coming in in 6 months for fasting blood work when you return from [place].  -Gave patient 14 days of Cipro twice daily for her travels to [place] in case she gets food borne illness etc.   -6 months of Prozac was given as well.   Diabetes Mellitus and Standards of Medical Care  Managing diabetes (diabetes mellitus) can be complicated. Your diabetes treatment may be managed by a team of health care providers, including:  A diet and nutrition specialist (registered dietitian).  A nurse.  A certified diabetes educator (CDE).  A diabetes specialist (endocrinologist).  An eye doctor.  A primary care provider.  A dentist.  Your health care providers follow a schedule in order to help you get the best quality of care. The following schedule is a general guideline for your diabetes management plan. Your health care providers may also give you more specific instructions.  HbA1c (hemoglobin A1c) test This test provides information about blood sugar (glucose) control over the previous 2-3 months. It is used to check whether your diabetes management plan needs to be adjusted.  If you are meeting your treatment goals, this test is done at least 2 times a year.  If you are not meeting treatment goals or if your treatment goals have changed, this  test is done 4 times a year.  Blood pressure test  This test is done at every routine medical visit. For most people, the goal is less than 130/80. Ask your health care provider what your goal blood pressure should be.  Dental and eye exams  Visit your dentist two times a year.  If you have type 1 diabetes, get an eye exam 3-5 years after you are diagnosed, and then once a year after your first exam. ? If you were diagnosed with type 1 diabetes as a child, get an eye exam when you are age 15 or older and have had diabetes for 3-5 years. After the first exam, you should get an eye exam once a year.  If you have type 2 diabetes, have an eye exam as soon as you are diagnosed, and then once a year after your first exam.  Foot care exam  Visual foot exams are done at every routine medical visit. The exams check for cuts, bruises, redness, blisters, sores, or other problems with the feet.  A complete foot exam is done by your health care provider once a year. This exam includes an inspection of the structure and skin of your feet, and a check of the pulses and sensation in your feet. ? Type 1 diabetes: Get your first exam 3-5 years after diagnosis. ? Type 2 diabetes: Get your first exam as soon as you are diagnosed.  Check your feet every day for  cuts, bruises, redness, blisters, or sores. If you have any of these or other problems that are not healing, contact your health care provider.  Kidney function test (urine microalbumin)  This test is done once a year. ? Type 1 diabetes: Get your first test 5 years after diagnosis. ? Type 2 diabetes: Get your first test as soon as you are diagnosed._  If you have chronic kidney disease (CKD), get a serum creatinine and estimated glomerular filtration rate (eGFR) test once a year.  Lipid profile (cholesterol, HDL, LDL, triglycerides)  This test should be done when you are diagnosed with diabetes, and every 5 years after the first test. If you are  on medicines to lower your cholesterol, you may need to get this test done every year. ? The goal for LDL is less than 100 mg/dL (5.5 mmol/L). If you are at high risk, the goal is less than 70 mg/dL (3.9 mmol/L). ? The goal for HDL is 40 mg/dL (2.2 mmol/L) for men and 50 mg/dL(2.8 mmol/L) for women. An HDL cholesterol of 60 mg/dL (3.3 mmol/L) or higher gives some protection against heart disease. ? The goal for triglycerides is less than 150 mg/dL (8.3 mmol/L).  Immunizations  The yearly flu (influenza) vaccine is recommended for everyone 6 months or older who has diabetes.  The pneumonia (pneumococcal) vaccine is recommended for everyone 2 years or older who has diabetes. If you are 75 or older, you may get the pneumonia vaccine as a series of two separate shots.  The hepatitis B vaccine is recommended for adults shortly after they have been diagnosed with diabetes.  The Tdap (tetanus, diphtheria, and pertussis) vaccine should be given: ? According to normal childhood vaccination schedules, for children. ? Every 10 years, for adults who have diabetes.  The shingles vaccine is recommended for people who have had chicken pox and are 50 years or older.  Mental and emotional health  Screening for symptoms of eating disorders, anxiety, and depression is recommended at the time of diagnosis and afterward as needed. If your screening shows that you have symptoms (you have a positive screening result), you may need further evaluation and be referred to a mental health care provider.  Diabetes self-management education  Education about how to manage your diabetes is recommended at diagnosis and ongoing as needed.  Treatment plan  Your treatment plan will be reviewed at every medical visit.  Summary  Managing diabetes (diabetes mellitus) can be complicated. Your diabetes treatment may be managed by a team of health care providers.  Your health care providers follow a schedule in order to  help you get the best quality of care.  Standards of care including having regular physical exams, blood tests, blood pressure monitoring, immunizations, screening tests, and education about how to manage your diabetes.  Your health care providers may also give you more specific instructions based on your individual health.      Type 2 Diabetes Mellitus, Self Care, Adult Caring for yourself after you have been diagnosed with type 2 diabetes (type 2 diabetes mellitus) means keeping your blood sugar (glucose) under control with a balance of:  Nutrition.  Exercise.  Lifestyle changes.  Medicines or insulin, if necessary.  Support from your team of health care providers and others.  The following information explains what you need to know to manage your diabetes at home. What do I need to do to manage my blood glucose?  Check your blood glucose every day, as often as told  by your health care provider.  Contact your health care provider if your blood glucose is above your target for 2 tests in a row.  Have your A1c (hemoglobin A1c) level checked at least two times a year, or as often as told by your health care provider. Your health care provider will set individualized treatment goals for you. Generally, the goal of treatment is to maintain the following blood glucose levels:  Before meals (preprandial): 80-130 mg/dL (4.4-7.2 mmol/L).  After meals (postprandial): below 180 mg/dL (10 mmol/L).  A1c level: less than 7%.  What do I need to know about hyperglycemia and hypoglycemia? What is hyperglycemia? Hyperglycemia, also called high blood glucose, occurs when blood glucose is too high.Make sure you know the early signs of hyperglycemia, such as:  Increased thirst.  Hunger.  Feeling very tired.  Needing to urinate more often than usual.  Blurry vision.  What is hypoglycemia? Hypoglycemia, also called low blood glucose, occurswith a blood glucose level at or below 70  mg/dL (3.9 mmol/L). The risk for hypoglycemia increases during or after exercise, during sleep, during illness, and when skipping meals or not eating for a long time (fasting). It is important to know the symptoms of hypoglycemia and treat it right away. Always have a 15-gram rapid-acting carbohydrate snack with you to treat low blood glucose. Family members and close friends should also know the symptoms and should understand how to treat hypoglycemia, in case you are not able to treat yourself. What are the symptoms of hypoglycemia? Hypoglycemia symptoms can include:  Hunger.  Anxiety.  Sweating and feeling clammy.  Confusion.  Dizziness or feeling light-headed.  Sleepiness.  Nausea.  Increased heart rate.  Headache.  Blurry vision.  Seizure.  Nightmares.  Tingling or numbness around the mouth, lips, or tongue.  A change in speech.  Decreased ability to concentrate.  A change in coordination.  Restless sleep.  Tremors or shakes.  Fainting.  Irritability.  How do I treat hypoglycemia?  If you are alert and able to swallow safely, follow the 15:15 rule:  Take 15 grams of a rapid-acting carbohydrate. Rapid-acting options include: ? 1 tube of glucose gel. ? 3 glucose pills. ? 6-8 pieces of hard candy. ? 4 oz (120 mL) of fruit juice. ? 4 oz (120 mL) of regular (not diet) soda.  Check your blood glucose 15 minutes after you take the carbohydrate.  If the repeat blood glucose level is still at or below 70 mg/dL (3.9 mmol/L), take 15 grams of a carbohydrate again.  If your blood glucose level does not increase above 70 mg/dL (3.9 mmol/L) after 3 tries, seek emergency medical care.  After your blood glucose level returns to normal, eat a meal or a snack within 1 hour.  How do I treat severe hypoglycemia? Severe hypoglycemia is when your blood glucose level is at or below 54 mg/dL (3 mmol/L). Severe hypoglycemia is an emergency. Do not wait to see if the  symptoms will go away. Get medical help right away. Call your local emergency services (911 in the [place] Do not drive yourself to the hospital. If you have severe hypoglycemia and you cannot eat or drink, you may need an injection of glucagon. A family member or close friend should learn how to check your blood glucose and how to give you a glucagon injection. Ask your health care provider if you need to have an emergency glucagon injection kit available. Severe hypoglycemia may need to be treated in a hospital.  The treatment may include getting glucose through an IV tube. You may also need treatment for the cause of your hypoglycemia. Can having diabetes put me at risk for other conditions? Having diabetes can put you at risk for other long-term (chronic) conditions, such as heart disease and kidney disease. Your health care provider may prescribe medicines to help prevent complications from diabetes. These medicines may include:  Aspirin.  Medicine to lower cholesterol.  Medicine to control blood pressure.  What else can I do to manage my diabetes? Take your diabetes medicines as told  If your health care provider prescribed insulin or diabetes medicines, take them every day.  Do not run out of insulin or other diabetes medicines that you take. Plan ahead so you always have these available.  If you use insulin, adjust your dosage based on how physically active you are and what foods you eat. Your health care provider will tell you how to adjust your dosage. Make healthy food choices  The things that you eat and drink affect your blood glucose and your insulin dosage. Making good choices helps to control your diabetes and prevent other health problems. A healthy meal plan includes eating lean proteins, complex carbohydrates, fresh fruits and vegetables, low-fat dairy products, and healthy fats. Make an appointment to see a diet and nutrition specialist (registered dietitian) to help you  create an eating plan that is right for you. Make sure that you:  Follow instructions from your health care provider about eating or drinking restrictions.  Drink enough fluid to keep your urine clear or pale yellow.  Eat healthy snacks between nutritious meals.  Track the carbohydrates that you eat. Do this by reading food labels and learning the standard serving sizes of foods.  Follow your sick day plan whenever you cannot eat or drink as usual. Make this plan in advance with your health care provider.  Stay active  Exercise regularly, as told by your health care provider. This may include:  Stretching and doing strength exercises, such as yoga or weightlifting, at least 2 times a week.  Doing at least 150 minutes of moderate-intensity or vigorous-intensity exercise each week. This could be brisk walking, biking, or water aerobics. ? Spread out your activity over at least 3 days of the week. ? Do not go more than 2 days in a row without doing some kind of physical activity.  When you start a new exercise or activity, work with your health care provider to adjust your insulin, medicines, or food intake as needed. Make healthy lifestyle choices  Do not use any tobacco products, such as cigarettes, chewing tobacco, and e-cigarettes. If you need help quitting, ask your health care provider.  If your health care provider says that alcohol is safe for you, limit alcohol intake to no more than 1 drink per day for nonpregnant women and 2 drinks per day for men. One drink equals 12 oz of beer, 5 oz of wine, or 1 oz of hard liquor.  Learn to manage stress. If you need help with this, ask your health care provider. Care for your body   Keep your immunizations up to date. In addition to getting vaccinations as told by your health care provider, it is recommended that you get vaccinated against the following illnesses: ? The flu (influenza). Get a flu shot every  year. ? Pneumonia. ? Hepatitis B.  Schedule an eye exam soon after your diagnosis, and then one time every year after that.  Check  your skin and feet every day for cuts, bruises, redness, blisters, or sores. Schedule a foot exam with your health care provider once every year.  Brush your teeth and gums two times a day, and floss at least one time a day. Visit your dentist at least once every 6 months.  Maintain a healthy weight. General instructions  Take over-the-counter and prescription medicines only as told by your health care provider.  Share your diabetes management plan with people in your workplace, school, and household.  Check your urine for ketones when you are ill and as told by your health care provider.  Ask your health care provider: ? Do I need to meet with a diabetes educator? ? Where can I find a support group for people with diabetes?  Carry a medical alert card or wear medical alert jewelry.  Keep all follow-up visits as told by your health care provider. This is important. Where to find more information: For more information about diabetes, visit:  American Diabetes Association (ADA): www.diabetes.org  American Association of Diabetes Educators (AADE): www.diabeteseducator.org/patient-resources  This information is not intended to replace advice given to you by your health care provider. Make sure you discuss any questions you have with your health care provider. Document Released: 10/29/2015 Document Revised: 12/13/2015 Document Reviewed: 08/10/2015 Elsevier Interactive Patient Education  2017 [employer].      Blood Glucose Monitoring, Adult Monitoring your blood sugar (glucose) helps you manage your diabetes. It also helps you and your health care provider determine how well your diabetes management plan is working. Blood glucose monitoring involves checking your blood glucose as often as directed, and keeping a record (log) of your results over  time. Why should I monitor my blood glucose? Checking your blood glucose regularly can:  Help you understand how food, exercise, illnesses, and medicines affect your blood glucose.  Let you know what your blood glucose is at any time. You can quickly tell if you are having low blood glucose (hypoglycemia) or high blood glucose (hyperglycemia).  Help you and your health care provider adjust your medicines as needed.  When should I check my blood glucose? Follow instructions from your health care provider about how often to check your blood glucose.   This may depend on:  The type of diabetes you have.  How well-controlled your diabetes is.  Medicines you are taking.  If you have type 1 diabetes:  Check your blood glucose at least 2 times a day.  Also check your blood glucose: ? Before every insulin injection. ? Before and after exercise. ? Between meals. ? 2 hours after a meal. ? Occasionally between 2:00 a.m. and 3:00 a.m., as directed. ? Before potentially dangerous tasks, like driving or using heavy machinery. ? At bedtime.  You may need to check your blood glucose more often, up to 6-10 times a day: ? If you use an insulin pump. ? If you need multiple daily injections (MDI). ? If your diabetes is not well-controlled. ? If you are ill. ? If you have a history of severe hypoglycemia. ? If you have a history of not knowing when your blood glucose is getting low (hypoglycemia unawareness).  If you have type 2 diabetes:  If you take insulin or other diabetes medicines, check your blood glucose at least 2 times a day.  If you are on intensive insulin therapy, check your blood glucose at least 4 times a day. Occasionally, you may also need to check between 2:00 a.m. and  3:00 a.m., as directed.  Also check your blood glucose: ? Before and after exercise. ? Before potentially dangerous tasks, like driving or using heavy machinery.  You may need to check your blood glucose  more often if: ? Your medicine is being adjusted. ? Your diabetes is not well-controlled. ? You are ill.  What is a blood glucose log?  A blood glucose log is a record of your blood glucose readings. It helps you and your health care provider: ? Look for patterns in your blood glucose over time. ? Adjust your diabetes management plan as needed.  Every time you check your blood glucose, write down your result and notes about things that may be affecting your blood glucose, such as your diet and exercise for the day.  Most glucose meters store a record of glucose readings in the meter. Some meters allow you to download your records to a computer. How do I check my blood glucose? Follow these steps to get accurate readings of your blood glucose: Supplies needed   Blood glucose meter.  Test strips for your meter. Each meter has its own strips. You must use the strips that come with your meter.  A needle to prick your finger (lancet). Do not use lancets more than once.  A device that holds the lancet (lancing device).  A journal or log book to write down your results.  Procedure  Wash your hands with soap and water.  Prick the side of your finger (not the tip) with the lancet. Use a different finger each time.  Gently rub the finger until a small drop of blood appears.  Follow instructions that come with your meter for inserting the test strip, applying blood to the strip, and using your blood glucose meter.  Write down your result and any notes.  Alternative testing sites  Some meters allow you to use areas of your body other than your finger (alternative sites) to test your blood.  If you think you may have hypoglycemia, or if you have hypoglycemia unawareness, do not use alternative sites. Use your finger instead.  Alternative sites may not be as accurate as the fingers, because blood flow is slower in these areas. This means that the result you get may be delayed, and it  may be different from the result that you would get from your finger.  The most common alternative sites are: ? Forearm. ? Thigh. ? Palm of the hand.  Additional tips  Always keep your supplies with you.  If you have questions or need help, all blood glucose meters have a 24-hour "hotline" number that you can call. You may also contact your health care provider.  After you use a few boxes of test strips, adjust (calibrate) your blood glucose meter by following instructions that came with your meter.    The American Diabetes Association suggests the following targets for most nonpregnant adults with diabetes.  More or less stringent glycemic goals may be appropriate for each individual.  A1C: Less than 7% A1C may also be reported as eAG: Less than 154 mg/dl Before a meal (preprandial plasma glucose): 80-130 mg/dl 1-2 hours after beginning of the meal (Postprandial plasma glucose)*: Less than 180 mg/dl  *Postprandial glucose may be targeted if A1C goals are not met despite reaching preprandial glucose goals.   GOALS in short:  The goals are for the Hgb A1C to be less than 7.0 & blood pressure to be less than 130/80.    It is  recommended that all diabetics are educated on and follow a healthy diabetic diet, exercise for 30 minutes 3-4 times per week (walking, biking, swimming, or machine), monitor blood glucose readings and bring that record with you to be reviewed at your next office visit.     You should be checking fasting blood sugars- especially after you eat poorly or eat really healthy, and also check 2 hour postprandial blood sugars after largest meal of the day.    Write these down and bring in your log at each office visit.    You will need to be seen every 3 months by the provider managing your Diabetes unless told otherwise by that provider.   You will need yearly eye exams from an eye specialist and foot exams to check the nerves of your feet.  Also, your urine should be  checked yearly as well to make sure excess protein is not present.   If you are checking your blood pressure at home, please record it and bring it to your next office visit.    Follow the Dietary Approaches to Stop Hypertension (DASH) diet (3 servings of fruit and vegetables daily, whole grains, low sodium, low-fat proteins).  See below.    Lastly, when it comes to your cholesterol, the goal is to have the HDL (good cholesterol) >40, and the LDL (bad cholesterol) <100.   It is recommended that you follow a heart healthy, low saturated and trans-fat diet and exercise for 30 minutes at least 5 times a week.     (( Check out the DASH diet = 1.5 Gram Low Sodium Diet   A 1.5 gram sodium diet restricts the amount of sodium in the diet to no more than 1.5 g or 1500 mg daily.  The American Heart Association recommends Americans over the age of 22 to consume no more than 1500 mg of sodium each day to reduce the risk of developing high blood pressure.  Research also shows that limiting sodium may reduce heart attack and stroke risk.  Many foods contain sodium for flavor and sometimes as a preservative.  When the amount of sodium in a diet needs to be low, it is important to know what to look for when choosing foods and drinks.  The following includes some information and guidelines to help make it easier for you to adapt to a low sodium diet.    QUICK TIPS  Do not add salt to food.  Avoid convenience items and fast food.  Choose unsalted snack foods.  Buy lower sodium products, often labeled as "lower sodium" or "no salt added."  Check food labels to learn how much sodium is in 1 serving.  When eating at a restaurant, ask that your food be prepared with less salt or none, if possible.    READING FOOD LABELS FOR SODIUM INFORMATION  The nutrition facts label is a good place to find how much sodium is in foods. Look for products with no more than 400 mg of sodium per serving.  Remember that 1.5 g =  1500 mg.  The food label may also list foods as:  Sodium-free: Less than 5 mg in a serving.  Very low sodium: 35 mg or less in a serving.  Low-sodium: 140 mg or less in a serving.  Light in sodium: 50% less sodium in a serving. For example, if a food that usually has 300 mg of sodium is changed to become light in sodium, it will have 150 mg of  sodium.  Reduced sodium: 25% less sodium in a serving. For example, if a food that usually has 400 mg of sodium is changed to reduced sodium, it will have 300 mg of sodium.    CHOOSING FOODS  Grains  Avoid: Salted crackers and snack items. Some cereals, including instant hot cereals. Bread stuffing and biscuit mixes. Seasoned rice or pasta mixes.  Choose: Unsalted snack items. Low-sodium cereals, oats, puffed wheat and rice, shredded wheat. English muffins and bread. Pasta.  Meats  Avoid: Salted, canned, smoked, spiced, pickled meats, including fish and poultry. Bacon, ham, sausage, cold cuts, hot dogs, anchovies.  Choose: Low-sodium canned tuna and salmon. Fresh or frozen meat, poultry, and fish.  Dairy  Avoid: Processed cheese and spreads. Cottage cheese. Buttermilk and condensed milk. Regular cheese.  Choose: Milk. Low-sodium cottage cheese. Yogurt. Sour cream. Low-sodium cheese.  Fruits and Vegetables  Avoid: Regular canned vegetables. Regular canned tomato sauce and paste. Frozen vegetables in sauces. Olives. Angie Fava. Relishes. Sauerkraut.  Choose: Low-sodium canned vegetables. Low-sodium tomato sauce and paste. Frozen or fresh vegetables. Fresh and frozen fruit.  Condiments  Avoid: Canned and packaged gravies. Worcestershire sauce. Tartar sauce. Barbecue sauce. Soy sauce. Steak sauce. Ketchup. Onion, garlic, and table salt. Meat flavorings and tenderizers.  Choose: Fresh and dried herbs and spices. Low-sodium varieties of mustard and ketchup. Lemon juice. Tabasco sauce. Horseradish.    SAMPLE 1.5 GRAM SODIUM MEAL PLAN:   Breakfast / Sodium  (mg)  1 cup low-fat milk / 143 mg  1 whole-wheat English muffin / 240 mg  1 tbs heart-healthy margarine / 153 mg  1 hard-boiled egg / 139 mg  1 small orange / 0 mg  Lunch / Sodium (mg)  1 cup raw carrots / 76 mg  2 tbs no salt added peanut butter / 5 mg  2 slices whole-wheat bread / 270 mg  1 tbs jelly / 6 mg   cup red grapes / 2 mg  Dinner / Sodium (mg)  1 cup whole-wheat pasta / 2 mg  1 cup low-sodium tomato sauce / 73 mg  3 oz lean ground beef / 57 mg  1 small side salad (1 cup raw spinach leaves,  cup cucumber,  cup yellow bell pepper) with 1 tsp olive oil and 1 tsp red wine vinegar / 25 mg  Snack / Sodium (mg)  1 container low-fat vanilla yogurt / 107 mg  3 graham cracker squares / 127 mg  Nutrient Analysis  Calories: 1745  Protein: 75 g  Carbohydrate: 237 g  Fat: 57 g  Sodium: 1425 mg  Document Released: 07/07/2005 Document Revised: 03/19/2011 Document Reviewed: 10/08/2009  ExitCare Patient Information 2012 [place].))    This information is not intended to replace advice given to you by your health care provider. Make sure you discuss any questions you have with your health care provider. Document Released: 07/10/2003 Document Revised: 01/25/2016 Document Reviewed: 12/17/2015 Elsevier Interactive Patient Education  2017 [employer].

## 2017-07-23 LAB — CBC
Hematocrit: 39.3 % (ref 34.0–46.6)
Hemoglobin: 13.4 g/dL (ref 11.1–15.9)
MCH: 29.8 pg (ref 26.6–33.0)
MCHC: 34.1 g/dL (ref 31.5–35.7)
MCV: 87 fL (ref 79–97)
Platelets: 445 10*3/uL — ABNORMAL HIGH (ref 150–379)
RBC: 4.5 x10E6/uL (ref 3.77–5.28)
RDW: 14.2 % (ref 12.3–15.4)
WBC: 7.3 10*3/uL (ref 3.4–10.8)

## 2017-07-27 ENCOUNTER — Ambulatory Visit: Payer: BLUE CROSS/BLUE SHIELD | Admitting: Family Medicine

## 2017-07-28 DIAGNOSIS — Z79899 Other long term (current) drug therapy: Secondary | ICD-10-CM | POA: Diagnosis not present

## 2017-07-28 DIAGNOSIS — R945 Abnormal results of liver function studies: Secondary | ICD-10-CM | POA: Diagnosis not present

## 2017-07-28 DIAGNOSIS — R749 Abnormal serum enzyme level, unspecified: Secondary | ICD-10-CM | POA: Diagnosis not present

## 2017-07-28 DIAGNOSIS — M0579 Rheumatoid arthritis with rheumatoid factor of multiple sites without organ or systems involvement: Secondary | ICD-10-CM | POA: Diagnosis not present

## 2017-10-07 ENCOUNTER — Telehealth: Payer: Self-pay | Admitting: Family Medicine

## 2017-10-07 ENCOUNTER — Other Ambulatory Visit: Payer: Self-pay

## 2017-10-07 DIAGNOSIS — R7989 Other specified abnormal findings of blood chemistry: Secondary | ICD-10-CM

## 2017-10-07 DIAGNOSIS — M8580 Other specified disorders of bone density and structure, unspecified site: Secondary | ICD-10-CM

## 2017-10-07 DIAGNOSIS — E785 Hyperlipidemia, unspecified: Secondary | ICD-10-CM

## 2017-10-07 DIAGNOSIS — E1169 Type 2 diabetes mellitus with other specified complication: Secondary | ICD-10-CM

## 2017-10-07 DIAGNOSIS — Z0000 Encounter for general adult medical examination without abnormal findings: Secondary | ICD-10-CM

## 2017-10-07 DIAGNOSIS — E118 Type 2 diabetes mellitus with unspecified complications: Secondary | ICD-10-CM

## 2017-10-07 DIAGNOSIS — R945 Abnormal results of liver function studies: Secondary | ICD-10-CM

## 2017-10-07 DIAGNOSIS — E559 Vitamin D deficiency, unspecified: Secondary | ICD-10-CM

## 2017-10-07 NOTE — Telephone Encounter (Signed)
[place] been [place] of the [place] 2 month, says Dr. Jenetta Downer told her to come in for lab when she get back (do not see Lab Orders in Epic) patient wants to make sure orders are listed before she comes in fr blood draw.  ---Please review for lab orders--- patient appt for 8:45 am tomorrow 10/08/17.  --Dion Body

## 2017-10-07 NOTE — Telephone Encounter (Signed)
Orders for labs placed based on review of chart.  Called patient and notified. MPulliam, CMA/RT(R)

## 2017-10-08 ENCOUNTER — Other Ambulatory Visit: Payer: BLUE CROSS/BLUE SHIELD

## 2017-10-09 ENCOUNTER — Other Ambulatory Visit: Payer: BLUE CROSS/BLUE SHIELD

## 2017-10-09 DIAGNOSIS — E118 Type 2 diabetes mellitus with unspecified complications: Secondary | ICD-10-CM

## 2017-10-09 DIAGNOSIS — M8580 Other specified disorders of bone density and structure, unspecified site: Secondary | ICD-10-CM

## 2017-10-09 DIAGNOSIS — E785 Hyperlipidemia, unspecified: Secondary | ICD-10-CM | POA: Diagnosis not present

## 2017-10-09 DIAGNOSIS — E1169 Type 2 diabetes mellitus with other specified complication: Secondary | ICD-10-CM

## 2017-10-09 DIAGNOSIS — Z0000 Encounter for general adult medical examination without abnormal findings: Secondary | ICD-10-CM

## 2017-10-09 DIAGNOSIS — E559 Vitamin D deficiency, unspecified: Secondary | ICD-10-CM

## 2017-10-09 DIAGNOSIS — R7989 Other specified abnormal findings of blood chemistry: Secondary | ICD-10-CM

## 2017-10-09 DIAGNOSIS — R945 Abnormal results of liver function studies: Secondary | ICD-10-CM

## 2017-10-10 LAB — COMPREHENSIVE METABOLIC PANEL
ALT: 23 IU/L (ref 0–32)
AST: 25 IU/L (ref 0–40)
Albumin/Globulin Ratio: 1.8 (ref 1.2–2.2)
Albumin: 4.2 g/dL (ref 3.6–4.8)
Alkaline Phosphatase: 93 IU/L (ref 39–117)
BUN/Creatinine Ratio: 16 (ref 12–28)
BUN: 11 mg/dL (ref 8–27)
Bilirubin Total: 0.4 mg/dL (ref 0.0–1.2)
CO2: 27 mmol/L (ref 20–29)
Calcium: 9.6 mg/dL (ref 8.7–10.3)
Chloride: 104 mmol/L (ref 96–106)
Creatinine, Ser: 0.68 mg/dL (ref 0.57–1.00)
GFR calc Af Amer: 109 mL/min/{1.73_m2} (ref >59)
GFR calc non Af Amer: 95 mL/min/{1.73_m2} (ref >59)
Globulin, Total: 2.4 g/dL (ref 1.5–4.5)
Glucose: 103 mg/dL — ABNORMAL HIGH (ref 65–99)
Potassium: 5.3 mmol/L — ABNORMAL HIGH (ref 3.5–5.2)
Sodium: 143 mmol/L (ref 134–144)
Total Protein: 6.6 g/dL (ref 6.0–8.5)

## 2017-10-10 LAB — CBC WITH DIFFERENTIAL/PLATELET
Basophils Absolute: 0 10*3/uL (ref 0.0–0.2)
Basos: 0 %
EOS (ABSOLUTE): 0.1 10*3/uL (ref 0.0–0.4)
Eos: 3 %
Hematocrit: 40.8 % (ref 34.0–46.6)
Hemoglobin: 13.7 g/dL (ref 11.1–15.9)
Immature Grans (Abs): 0 10*3/uL (ref 0.0–0.1)
Immature Granulocytes: 0 %
Lymphocytes Absolute: 2.1 10*3/uL (ref 0.7–3.1)
Lymphs: 39 %
MCH: 29.8 pg (ref 26.6–33.0)
MCHC: 33.6 g/dL (ref 31.5–35.7)
MCV: 89 fL (ref 79–97)
Monocytes Absolute: 0.4 10*3/uL (ref 0.1–0.9)
Monocytes: 8 %
Neutrophils Absolute: 2.8 10*3/uL (ref 1.4–7.0)
Neutrophils: 50 %
Platelets: 430 10*3/uL — ABNORMAL HIGH (ref 150–379)
RBC: 4.6 x10E6/uL (ref 3.77–5.28)
RDW: 15.3 % (ref 12.3–15.4)
WBC: 5.4 10*3/uL (ref 3.4–10.8)

## 2017-10-10 LAB — VITAMIN B12: Vitamin B-12: 497 pg/mL (ref 232–1245)

## 2017-10-10 LAB — TSH: TSH: 3 u[IU]/mL (ref 0.450–4.500)

## 2017-10-10 LAB — LIPID PANEL
Chol/HDL Ratio: 4.5 ratio — ABNORMAL HIGH (ref 0.0–4.4)
Cholesterol, Total: 253 mg/dL — ABNORMAL HIGH (ref 100–199)
HDL: 56 mg/dL (ref >39)
LDL Calculated: 172 mg/dL — ABNORMAL HIGH (ref 0–99)
Triglycerides: 123 mg/dL (ref 0–149)
VLDL Cholesterol Cal: 25 mg/dL (ref 5–40)

## 2017-10-10 LAB — HEMOGLOBIN A1C
Est. average glucose Bld gHb Est-mCnc: 128 mg/dL
Hgb A1c MFr Bld: 6.1 % — ABNORMAL HIGH (ref 4.8–5.6)

## 2017-10-10 LAB — VITAMIN D 25 HYDROXY (VIT D DEFICIENCY, FRACTURES): Vit D, 25-Hydroxy: 33.9 ng/mL (ref 30.0–100.0)

## 2017-10-13 ENCOUNTER — Other Ambulatory Visit: Payer: Self-pay | Admitting: Family Medicine

## 2017-10-13 DIAGNOSIS — Z79899 Other long term (current) drug therapy: Secondary | ICD-10-CM

## 2017-10-13 DIAGNOSIS — E785 Hyperlipidemia, unspecified: Principal | ICD-10-CM

## 2017-10-13 DIAGNOSIS — E1169 Type 2 diabetes mellitus with other specified complication: Secondary | ICD-10-CM

## 2017-10-13 MED ORDER — ATORVASTATIN CALCIUM 40 MG PO TABS: 40.0000 mg | ORAL_TABLET | Freq: Every day | ORAL | 0 refills | Status: AC

## 2017-10-14 ENCOUNTER — Other Ambulatory Visit: Payer: Self-pay

## 2017-10-14 DIAGNOSIS — E559 Vitamin D deficiency, unspecified: Secondary | ICD-10-CM

## 2017-10-14 MED ORDER — VITAMIN D (ERGOCALCIFEROL) 1.25 MG (50000 UNIT) PO CAPS: ORAL_CAPSULE | ORAL | 3 refills | Status: AC

## 2017-10-16 ENCOUNTER — Telehealth: Payer: Self-pay | Admitting: Family Medicine

## 2017-10-16 NOTE — Telephone Encounter (Signed)
Patient is on 40 mg of atorvastatin but would like to drop to 20 mg, patient says she worked well at that level. She wants to discuss this with the clinic staff about making this happen. Please advise

## 2017-10-19 ENCOUNTER — Other Ambulatory Visit: Payer: Self-pay

## 2017-10-19 NOTE — Progress Notes (Signed)
Error. MPulliam, CMA/RT(R)  

## 2017-10-19 NOTE — Telephone Encounter (Signed)
Called patient to inform her that 40 mg was called into the pharmacy by Dr. Raliegh Scarlet. MPulliam, CMA/RT(R)

## 2017-11-09 DIAGNOSIS — F411 Generalized anxiety disorder: Secondary | ICD-10-CM | POA: Diagnosis not present

## 2017-11-26 DIAGNOSIS — F411 Generalized anxiety disorder: Secondary | ICD-10-CM | POA: Diagnosis not present

## 2017-11-30 DIAGNOSIS — F411 Generalized anxiety disorder: Secondary | ICD-10-CM | POA: Diagnosis not present

## 2017-12-03 DIAGNOSIS — F411 Generalized anxiety disorder: Secondary | ICD-10-CM | POA: Diagnosis not present

## 2017-12-10 DIAGNOSIS — F411 Generalized anxiety disorder: Secondary | ICD-10-CM | POA: Diagnosis not present

## 2017-12-11 ENCOUNTER — Other Ambulatory Visit (INDEPENDENT_AMBULATORY_CARE_PROVIDER_SITE_OTHER): Payer: BLUE CROSS/BLUE SHIELD

## 2017-12-11 DIAGNOSIS — Z79899 Other long term (current) drug therapy: Secondary | ICD-10-CM | POA: Diagnosis not present

## 2017-12-11 DIAGNOSIS — E785 Hyperlipidemia, unspecified: Secondary | ICD-10-CM

## 2017-12-11 DIAGNOSIS — E1169 Type 2 diabetes mellitus with other specified complication: Secondary | ICD-10-CM | POA: Diagnosis not present

## 2017-12-12 LAB — ALT: ALT: 18 IU/L (ref 0–32)

## 2017-12-15 DIAGNOSIS — E119 Type 2 diabetes mellitus without complications: Secondary | ICD-10-CM | POA: Diagnosis not present

## 2017-12-15 DIAGNOSIS — M0570 Rheumatoid arthritis with rheumatoid factor of unspecified site without organ or systems involvement: Secondary | ICD-10-CM | POA: Diagnosis not present

## 2017-12-15 DIAGNOSIS — H40023 Open angle with borderline findings, high risk, bilateral: Secondary | ICD-10-CM | POA: Diagnosis not present

## 2017-12-16 DIAGNOSIS — M0579 Rheumatoid arthritis with rheumatoid factor of multiple sites without organ or systems involvement: Secondary | ICD-10-CM | POA: Diagnosis not present

## 2017-12-17 ENCOUNTER — Ambulatory Visit: Payer: BLUE CROSS/BLUE SHIELD | Admitting: Family Medicine

## 2017-12-17 DIAGNOSIS — F411 Generalized anxiety disorder: Secondary | ICD-10-CM | POA: Diagnosis not present

## 2017-12-28 DIAGNOSIS — F411 Generalized anxiety disorder: Secondary | ICD-10-CM | POA: Diagnosis not present

## 2017-12-31 DIAGNOSIS — F411 Generalized anxiety disorder: Secondary | ICD-10-CM | POA: Diagnosis not present

## 2018-01-05 DIAGNOSIS — F411 Generalized anxiety disorder: Secondary | ICD-10-CM | POA: Diagnosis not present

## 2018-01-14 DIAGNOSIS — F411 Generalized anxiety disorder: Secondary | ICD-10-CM | POA: Diagnosis not present

## 2018-01-19 DIAGNOSIS — N959 Unspecified menopausal and perimenopausal disorder: Secondary | ICD-10-CM | POA: Diagnosis not present

## 2018-01-19 DIAGNOSIS — G4733 Obstructive sleep apnea (adult) (pediatric): Secondary | ICD-10-CM | POA: Diagnosis not present

## 2018-01-19 DIAGNOSIS — E119 Type 2 diabetes mellitus without complications: Secondary | ICD-10-CM | POA: Diagnosis not present

## 2018-01-19 DIAGNOSIS — F411 Generalized anxiety disorder: Secondary | ICD-10-CM | POA: Diagnosis not present

## 2018-01-20 DIAGNOSIS — E7800 Pure hypercholesterolemia, unspecified: Secondary | ICD-10-CM | POA: Diagnosis not present

## 2018-01-20 DIAGNOSIS — E559 Vitamin D deficiency, unspecified: Secondary | ICD-10-CM | POA: Diagnosis not present

## 2018-01-20 DIAGNOSIS — E119 Type 2 diabetes mellitus without complications: Secondary | ICD-10-CM | POA: Diagnosis not present

## 2018-02-03 DIAGNOSIS — G4733 Obstructive sleep apnea (adult) (pediatric): Secondary | ICD-10-CM | POA: Diagnosis not present

## 2018-02-03 DIAGNOSIS — E1169 Type 2 diabetes mellitus with other specified complication: Secondary | ICD-10-CM | POA: Diagnosis not present

## 2018-02-03 DIAGNOSIS — G4700 Insomnia, unspecified: Secondary | ICD-10-CM | POA: Diagnosis not present

## 2018-02-04 DIAGNOSIS — F411 Generalized anxiety disorder: Secondary | ICD-10-CM | POA: Diagnosis not present

## 2018-02-08 DIAGNOSIS — F411 Generalized anxiety disorder: Secondary | ICD-10-CM | POA: Diagnosis not present

## 2018-02-23 DIAGNOSIS — Z79899 Other long term (current) drug therapy: Secondary | ICD-10-CM | POA: Diagnosis not present

## 2018-02-23 DIAGNOSIS — F411 Generalized anxiety disorder: Secondary | ICD-10-CM | POA: Diagnosis not present

## 2018-02-23 DIAGNOSIS — M0579 Rheumatoid arthritis with rheumatoid factor of multiple sites without organ or systems involvement: Secondary | ICD-10-CM | POA: Diagnosis not present

## 2018-02-23 DIAGNOSIS — M79643 Pain in unspecified hand: Secondary | ICD-10-CM | POA: Diagnosis not present

## 2018-02-23 DIAGNOSIS — M8580 Other specified disorders of bone density and structure, unspecified site: Secondary | ICD-10-CM | POA: Diagnosis not present

## 2018-06-15 DIAGNOSIS — N952 Postmenopausal atrophic vaginitis: Secondary | ICD-10-CM | POA: Diagnosis not present

## 2018-06-15 DIAGNOSIS — N941 Unspecified dyspareunia: Secondary | ICD-10-CM | POA: Diagnosis not present

## 2018-06-15 DIAGNOSIS — Z01411 Encounter for gynecological examination (general) (routine) with abnormal findings: Secondary | ICD-10-CM | POA: Diagnosis not present

## 2018-06-25 DIAGNOSIS — Z1231 Encounter for screening mammogram for malignant neoplasm of breast: Secondary | ICD-10-CM | POA: Diagnosis not present

## 2018-06-29 DIAGNOSIS — E119 Type 2 diabetes mellitus without complications: Secondary | ICD-10-CM | POA: Diagnosis not present

## 2018-06-29 DIAGNOSIS — F411 Generalized anxiety disorder: Secondary | ICD-10-CM | POA: Diagnosis not present

## 2018-06-29 DIAGNOSIS — H2513 Age-related nuclear cataract, bilateral: Secondary | ICD-10-CM | POA: Diagnosis not present

## 2018-06-29 DIAGNOSIS — M0570 Rheumatoid arthritis with rheumatoid factor of unspecified site without organ or systems involvement: Secondary | ICD-10-CM | POA: Diagnosis not present

## 2018-06-29 DIAGNOSIS — H40023 Open angle with borderline findings, high risk, bilateral: Secondary | ICD-10-CM | POA: Diagnosis not present

## 2018-07-06 DIAGNOSIS — F411 Generalized anxiety disorder: Secondary | ICD-10-CM | POA: Diagnosis not present

## 2018-07-07 DIAGNOSIS — M0579 Rheumatoid arthritis with rheumatoid factor of multiple sites without organ or systems involvement: Secondary | ICD-10-CM | POA: Diagnosis not present

## 2018-07-07 DIAGNOSIS — E559 Vitamin D deficiency, unspecified: Secondary | ICD-10-CM | POA: Diagnosis not present

## 2018-07-07 DIAGNOSIS — M8589 Other specified disorders of bone density and structure, multiple sites: Secondary | ICD-10-CM | POA: Diagnosis not present

## 2018-07-07 DIAGNOSIS — M8580 Other specified disorders of bone density and structure, unspecified site: Secondary | ICD-10-CM | POA: Diagnosis not present

## 2018-07-07 DIAGNOSIS — Z79899 Other long term (current) drug therapy: Secondary | ICD-10-CM | POA: Diagnosis not present

## 2018-07-08 DIAGNOSIS — F411 Generalized anxiety disorder: Secondary | ICD-10-CM | POA: Diagnosis not present

## 2018-07-09 DIAGNOSIS — M0579 Rheumatoid arthritis with rheumatoid factor of multiple sites without organ or systems involvement: Secondary | ICD-10-CM | POA: Diagnosis not present

## 2018-07-09 DIAGNOSIS — Z79899 Other long term (current) drug therapy: Secondary | ICD-10-CM | POA: Diagnosis not present

## 2018-07-12 DIAGNOSIS — G4700 Insomnia, unspecified: Secondary | ICD-10-CM | POA: Diagnosis not present

## 2018-07-12 DIAGNOSIS — E7800 Pure hypercholesterolemia, unspecified: Secondary | ICD-10-CM | POA: Diagnosis not present

## 2018-07-12 DIAGNOSIS — E1169 Type 2 diabetes mellitus with other specified complication: Secondary | ICD-10-CM | POA: Diagnosis not present

## 2018-07-12 DIAGNOSIS — Z0000 Encounter for general adult medical examination without abnormal findings: Secondary | ICD-10-CM | POA: Diagnosis not present

## 2018-07-12 DIAGNOSIS — Z23 Encounter for immunization: Secondary | ICD-10-CM | POA: Diagnosis not present

## 2018-11-23 DIAGNOSIS — M0579 Rheumatoid arthritis with rheumatoid factor of multiple sites without organ or systems involvement: Secondary | ICD-10-CM | POA: Diagnosis not present

## 2018-11-24 DIAGNOSIS — M0579 Rheumatoid arthritis with rheumatoid factor of multiple sites without organ or systems involvement: Secondary | ICD-10-CM | POA: Diagnosis not present

## 2019-02-17 DIAGNOSIS — G4700 Insomnia, unspecified: Secondary | ICD-10-CM | POA: Diagnosis not present

## 2019-04-08 DIAGNOSIS — M0579 Rheumatoid arthritis with rheumatoid factor of multiple sites without organ or systems involvement: Secondary | ICD-10-CM | POA: Diagnosis not present

## 2019-06-27 DIAGNOSIS — M0579 Rheumatoid arthritis with rheumatoid factor of multiple sites without organ or systems involvement: Secondary | ICD-10-CM | POA: Diagnosis not present

## 2019-06-29 DIAGNOSIS — M0579 Rheumatoid arthritis with rheumatoid factor of multiple sites without organ or systems involvement: Secondary | ICD-10-CM | POA: Diagnosis not present

## 2019-06-29 DIAGNOSIS — Z79899 Other long term (current) drug therapy: Secondary | ICD-10-CM | POA: Diagnosis not present

## 2019-06-29 DIAGNOSIS — M8580 Other specified disorders of bone density and structure, unspecified site: Secondary | ICD-10-CM | POA: Diagnosis not present

## 2019-06-29 DIAGNOSIS — E785 Hyperlipidemia, unspecified: Secondary | ICD-10-CM | POA: Diagnosis not present

## 2019-06-29 DIAGNOSIS — Z1231 Encounter for screening mammogram for malignant neoplasm of breast: Secondary | ICD-10-CM | POA: Diagnosis not present

## 2019-09-28 DIAGNOSIS — Z79899 Other long term (current) drug therapy: Secondary | ICD-10-CM | POA: Diagnosis not present

## 2019-09-28 DIAGNOSIS — M0579 Rheumatoid arthritis with rheumatoid factor of multiple sites without organ or systems involvement: Secondary | ICD-10-CM | POA: Diagnosis not present

## 2020-03-13 DIAGNOSIS — Z79899 Other long term (current) drug therapy: Secondary | ICD-10-CM | POA: Diagnosis not present

## 2020-03-13 DIAGNOSIS — E1169 Type 2 diabetes mellitus with other specified complication: Secondary | ICD-10-CM | POA: Diagnosis not present

## 2020-03-13 DIAGNOSIS — E7800 Pure hypercholesterolemia, unspecified: Secondary | ICD-10-CM | POA: Diagnosis not present

## 2020-03-13 DIAGNOSIS — Z0000 Encounter for general adult medical examination without abnormal findings: Secondary | ICD-10-CM | POA: Diagnosis not present

## 2020-04-02 DIAGNOSIS — M0579 Rheumatoid arthritis with rheumatoid factor of multiple sites without organ or systems involvement: Secondary | ICD-10-CM | POA: Diagnosis not present

## 2020-04-02 DIAGNOSIS — Z79899 Other long term (current) drug therapy: Secondary | ICD-10-CM | POA: Diagnosis not present

## 2020-04-02 DIAGNOSIS — M8580 Other specified disorders of bone density and structure, unspecified site: Secondary | ICD-10-CM | POA: Diagnosis not present

## 2020-04-02 DIAGNOSIS — E785 Hyperlipidemia, unspecified: Secondary | ICD-10-CM | POA: Diagnosis not present

## 2020-04-03 DIAGNOSIS — G4700 Insomnia, unspecified: Secondary | ICD-10-CM | POA: Diagnosis not present

## 2020-06-21 ENCOUNTER — Telehealth (HOSPITAL_COMMUNITY): Payer: Self-pay

## 2020-06-21 ENCOUNTER — Other Ambulatory Visit: Payer: Self-pay | Admitting: Nurse Practitioner

## 2020-06-21 DIAGNOSIS — M05711 Rheumatoid arthritis with rheumatoid factor of right shoulder without organ or systems involvement: Secondary | ICD-10-CM

## 2020-06-21 DIAGNOSIS — E118 Type 2 diabetes mellitus with unspecified complications: Secondary | ICD-10-CM

## 2020-06-21 DIAGNOSIS — U071 COVID-19: Secondary | ICD-10-CM

## 2020-06-21 NOTE — Progress Notes (Signed)
I connected by phone with Michele Adkins on 06/21/2020 at 4:42 PM to discuss the potential use of a new treatment for mild to moderate COVID-19 viral infection in non-hospitalized patients.  This patient is a 64 y.o. female that meets the FDA criteria for Emergency Use Authorization of COVID monoclonal antibody casirivimab/imdevimab, bamlanivimab/eteseviamb, or sotrovimab.  Has a (+) direct SARS-CoV-2 viral test result  Has mild or moderate COVID-19   Is NOT hospitalized due to COVID-19  Is within 10 days of symptom onset  Has at least one of the high risk factor(s) for progression to severe COVID-19 and/or hospitalization as defined in EUA.  Specific high risk criteria : Older age (>/= 64 yo), Immunosuppressive Disease or Treatment and Cardiovascular disease or hypertension   I have spoken and communicated the following to the patient or parent/caregiver regarding COVID monoclonal antibody treatment:  1. FDA has authorized the emergency use for the treatment of mild to moderate COVID-19 in adults and pediatric patients with positive results of direct SARS-CoV-2 viral testing who are 31 years of age and older weighing at least 40 kg, and who are at high risk for progressing to severe COVID-19 and/or hospitalization.  2. The significant known and potential risks and benefits of COVID monoclonal antibody, and the extent to which such potential risks and benefits are unknown.  3. Information on available alternative treatments and the risks and benefits of those alternatives, including clinical trials.  4. Patients treated with COVID monoclonal antibody should continue to self-isolate and use infection control measures (e.g., wear mask, isolate, social distance, avoid sharing personal items, clean and disinfect "high touch" surfaces, and frequent handwashing) according to CDC guidelines.   5. The patient or parent/caregiver has the option to accept or refuse COVID monoclonal antibody  treatment.  After reviewing this information with the patient, the patient has agreed to receive one of the available covid 19 monoclonal antibodies and will be provided an appropriate fact sheet prior to infusion. Jobe Gibbon, NP 06/21/2020 4:42 PM

## 2020-06-21 NOTE — Telephone Encounter (Signed)
Called patient to pre-screen for monoclonal antibody infusion after receiving recent positive test. Patient qualifies based off off co-morbid condition and/or member of an at risk group. CPT and REV codes provided for insurance purposes.   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 RN forwarded information to APP's for additional screening/scheduling.   Orland Visconti Lorita Officer, RN

## 2020-06-22 ENCOUNTER — Ambulatory Visit (HOSPITAL_COMMUNITY): Payer: BC Managed Care – PPO

## 2020-06-25 ENCOUNTER — Ambulatory Visit (HOSPITAL_COMMUNITY)
Admission: RE | Admit: 2020-06-25 | Discharge: 2020-06-25 | Disposition: A | Discharge status: Home / Self Care | Payer: BC Managed Care – PPO | Source: Ambulatory Visit | Attending: Pulmonary Disease | Admitting: Pulmonary Disease

## 2020-06-25 DIAGNOSIS — M05711 Rheumatoid arthritis with rheumatoid factor of right shoulder without organ or systems involvement: Secondary | ICD-10-CM | POA: Diagnosis not present

## 2020-06-25 DIAGNOSIS — E118 Type 2 diabetes mellitus with unspecified complications: Secondary | ICD-10-CM

## 2020-06-25 DIAGNOSIS — U071 COVID-19: Secondary | ICD-10-CM | POA: Diagnosis not present

## 2020-06-25 MED ORDER — SODIUM CHLORIDE 0.9 % IV SOLN: INTRAVENOUS | Status: DC | PRN

## 2020-06-25 MED ORDER — SODIUM CHLORIDE 0.9 % IV SOLN: Freq: Once | INTRAVENOUS | Status: AC

## 2020-06-25 MED ORDER — DIPHENHYDRAMINE HCL 50 MG/ML IJ SOLN: 50.0000 mg | Freq: Once | INTRAMUSCULAR | Status: DC | PRN

## 2020-06-25 MED ORDER — EPINEPHRINE 0.3 MG/0.3ML IJ SOAJ: 0.3000 mg | Freq: Once | INTRAMUSCULAR | Status: DC | PRN

## 2020-06-25 MED ORDER — ALBUTEROL SULFATE HFA 108 (90 BASE) MCG/ACT IN AERS: 2.0000 | INHALATION_SPRAY | Freq: Once | RESPIRATORY_TRACT | Status: DC | PRN

## 2020-06-25 MED ORDER — METHYLPREDNISOLONE SODIUM SUCC 125 MG IJ SOLR: 125.0000 mg | Freq: Once | INTRAMUSCULAR | Status: DC | PRN

## 2020-06-25 MED ORDER — FAMOTIDINE IN NACL 20-0.9 MG/50ML-% IV SOLN: 20.0000 mg | Freq: Once | INTRAVENOUS | Status: DC | PRN

## 2020-06-25 NOTE — Progress Notes (Signed)
  Diagnosis: COVID-19  Physician: Dr. Joya Gaskins   Procedure: [facility] Med: casirivimab\imdevimab infusion - Provided patient with casirivimab\imdevimab fact sheet for patients, parents and caregivers prior to infusion.  Complications: No immediate complications noted.  Discharge: Discharged home   Michele Adkins 06/25/2020

## 2020-06-25 NOTE — Discharge Instructions (Signed)
10 Things You Can Do to Manage Your COVID-19 Symptoms at Home If you have possible or confirmed COVID-19: 1. Stay home from work and school. And stay away from other public places. If you must go out, avoid using any kind of public transportation, ridesharing, or taxis. 2. Monitor your symptoms carefully. If your symptoms get worse, call your healthcare provider immediately. 3. Get rest and stay hydrated. 4. If you have a medical appointment, call the healthcare provider ahead of time and tell them that you have or may have COVID-19. 5. For medical emergencies, call 911 and notify the dispatch personnel that you have or may have COVID-19. 6. Cover your cough and sneezes with a tissue or use the inside of your elbow. 7. Wash your hands often with soap and water for at least 20 seconds or clean your hands with an alcohol-based hand sanitizer that contains at least 60% alcohol. 8. As much as possible, stay in a specific room and away from other people in your home. Also, you should use a separate bathroom, if available. If you need to be around other people in or outside of the home, wear a mask. 9. Avoid sharing personal items with other people in your household, like dishes, towels, and bedding. 10. Clean all surfaces that are touched often, like counters, tabletops, and doorknobs. Use household cleaning sprays or wipes according to the label instructions. cdc.gov/coronavirus 01/19/2019 This information is not intended to replace advice given to you by your health care provider. Make sure you discuss any questions you have with your health care provider. Document Revised: 06/23/2019 Document Reviewed: 06/23/2019 Elsevier Patient Education  2020 [employer] What types of side effects do monoclonal antibody drugs cause?  Common side effects  In general, the more common side effects caused by monoclonal antibody drugs include: . Allergic reactions, such as hives or itching . Flu-like signs and  symptoms, including chills, fatigue, fever, and muscle aches and pains . Nausea, vomiting . Diarrhea . Skin rashes . Low blood pressure   The CDC is recommending patients who receive monoclonal antibody treatments wait at least 90 days before being vaccinated.  Currently, there are no data on the safety and efficacy of mRNA COVID-19 vaccines in persons who received monoclonal antibodies or convalescent plasma as part of COVID-19 treatment. Based on the estimated half-life of such therapies as well as evidence suggesting that reinfection is uncommon in the 90 days after initial infection, vaccination should be deferred for at least 90 days, as a precautionary measure until additional information becomes available, to avoid interference of the antibody treatment with vaccine-induced immune responses. If you have any questions or concerns after the infusion please call the Advanced Practice Provider on call at [phone]. This number is ONLY intended for your use regarding questions or concerns about the infusion post-treatment side-effects.  Please do not provide this number to others for use. For return to work notes please contact your primary care provider.   If someone you know is interested in receiving treatment please have them call the COVID hotline at [phone].   

## 2020-06-25 NOTE — Progress Notes (Signed)
Patient reviewed Fact Sheet for Patients, Parents, and Caregivers for Emergency Use Authorization (EUA) of Casi/imdevimab for the Treatment of Coronavirus. Patient also reviewed and is agreeable to the estimated cost of treatment. Patient is agreeable to proceed.    

## 2020-06-27 ENCOUNTER — Other Ambulatory Visit: Payer: BC Managed Care – PPO

## 2020-06-27 DIAGNOSIS — Z20822 Contact with and (suspected) exposure to covid-19: Secondary | ICD-10-CM | POA: Diagnosis not present

## 2020-06-28 LAB — NOVEL CORONAVIRUS, NAA: SARS-CoV-2, NAA: DETECTED — AB

## 2020-06-28 LAB — SARS-COV-2, NAA 2 DAY TAT

## 2020-07-02 DIAGNOSIS — Z79899 Other long term (current) drug therapy: Secondary | ICD-10-CM | POA: Diagnosis not present

## 2020-07-02 DIAGNOSIS — M0579 Rheumatoid arthritis with rheumatoid factor of multiple sites without organ or systems involvement: Secondary | ICD-10-CM | POA: Diagnosis not present

## 2020-07-04 DIAGNOSIS — Z111 Encounter for screening for respiratory tuberculosis: Secondary | ICD-10-CM | POA: Diagnosis not present

## 2020-07-04 DIAGNOSIS — Z1231 Encounter for screening mammogram for malignant neoplasm of breast: Secondary | ICD-10-CM | POA: Diagnosis not present

## 2020-10-24 DIAGNOSIS — Z79899 Other long term (current) drug therapy: Secondary | ICD-10-CM | POA: Diagnosis not present

## 2020-10-24 DIAGNOSIS — M0579 Rheumatoid arthritis with rheumatoid factor of multiple sites without organ or systems involvement: Secondary | ICD-10-CM | POA: Diagnosis not present

## 2020-10-29 DIAGNOSIS — M8580 Other specified disorders of bone density and structure, unspecified site: Secondary | ICD-10-CM | POA: Diagnosis not present

## 2020-10-29 DIAGNOSIS — M0579 Rheumatoid arthritis with rheumatoid factor of multiple sites without organ or systems involvement: Secondary | ICD-10-CM | POA: Diagnosis not present

## 2020-10-29 DIAGNOSIS — Z79899 Other long term (current) drug therapy: Secondary | ICD-10-CM | POA: Diagnosis not present

## 2020-10-29 DIAGNOSIS — E559 Vitamin D deficiency, unspecified: Secondary | ICD-10-CM | POA: Diagnosis not present

## 2020-10-29 DIAGNOSIS — E785 Hyperlipidemia, unspecified: Secondary | ICD-10-CM | POA: Diagnosis not present

## 2021-07-31 DIAGNOSIS — M0579 Rheumatoid arthritis with rheumatoid factor of multiple sites without organ or systems involvement: Secondary | ICD-10-CM | POA: Diagnosis not present

## 2021-08-19 DIAGNOSIS — F4322 Adjustment disorder with anxiety: Secondary | ICD-10-CM | POA: Diagnosis not present

## 2021-08-19 DIAGNOSIS — M059 Rheumatoid arthritis with rheumatoid factor, unspecified: Secondary | ICD-10-CM | POA: Diagnosis not present

## 2021-08-19 DIAGNOSIS — Z23 Encounter for immunization: Secondary | ICD-10-CM | POA: Diagnosis not present

## 2021-08-19 DIAGNOSIS — E1169 Type 2 diabetes mellitus with other specified complication: Secondary | ICD-10-CM | POA: Diagnosis not present

## 2021-08-19 DIAGNOSIS — E222 Syndrome of inappropriate secretion of antidiuretic hormone: Secondary | ICD-10-CM | POA: Diagnosis not present

## 2021-08-19 DIAGNOSIS — Z79899 Other long term (current) drug therapy: Secondary | ICD-10-CM | POA: Diagnosis not present

## 2021-08-19 DIAGNOSIS — E7800 Pure hypercholesterolemia, unspecified: Secondary | ICD-10-CM | POA: Diagnosis not present

## 2021-11-05 DIAGNOSIS — Z79899 Other long term (current) drug therapy: Secondary | ICD-10-CM | POA: Diagnosis not present

## 2021-11-05 DIAGNOSIS — M8580 Other specified disorders of bone density and structure, unspecified site: Secondary | ICD-10-CM | POA: Diagnosis not present

## 2021-11-05 DIAGNOSIS — M25562 Pain in left knee: Secondary | ICD-10-CM | POA: Diagnosis not present

## 2021-11-05 DIAGNOSIS — M2550 Pain in unspecified joint: Secondary | ICD-10-CM | POA: Diagnosis not present

## 2021-11-05 DIAGNOSIS — E785 Hyperlipidemia, unspecified: Secondary | ICD-10-CM | POA: Diagnosis not present

## 2021-11-05 DIAGNOSIS — M0579 Rheumatoid arthritis with rheumatoid factor of multiple sites without organ or systems involvement: Secondary | ICD-10-CM | POA: Diagnosis not present

## 2022-01-22 DIAGNOSIS — E7800 Pure hypercholesterolemia, unspecified: Secondary | ICD-10-CM | POA: Diagnosis not present

## 2022-01-22 DIAGNOSIS — R490 Dysphonia: Secondary | ICD-10-CM | POA: Diagnosis not present

## 2022-01-22 DIAGNOSIS — M059 Rheumatoid arthritis with rheumatoid factor, unspecified: Secondary | ICD-10-CM | POA: Diagnosis not present

## 2022-01-22 DIAGNOSIS — K219 Gastro-esophageal reflux disease without esophagitis: Secondary | ICD-10-CM | POA: Diagnosis not present

## 2022-01-22 DIAGNOSIS — Z0000 Encounter for general adult medical examination without abnormal findings: Secondary | ICD-10-CM | POA: Diagnosis not present

## 2022-01-22 DIAGNOSIS — E1169 Type 2 diabetes mellitus with other specified complication: Secondary | ICD-10-CM | POA: Diagnosis not present

## 2022-01-22 DIAGNOSIS — E559 Vitamin D deficiency, unspecified: Secondary | ICD-10-CM | POA: Diagnosis not present

## 2022-02-06 DIAGNOSIS — Z79899 Other long term (current) drug therapy: Secondary | ICD-10-CM | POA: Diagnosis not present

## 2022-02-18 DIAGNOSIS — H40023 Open angle with borderline findings, high risk, bilateral: Secondary | ICD-10-CM | POA: Diagnosis not present

## 2022-02-18 DIAGNOSIS — E119 Type 2 diabetes mellitus without complications: Secondary | ICD-10-CM | POA: Diagnosis not present

## 2022-02-18 DIAGNOSIS — H5213 Myopia, bilateral: Secondary | ICD-10-CM | POA: Diagnosis not present

## 2022-02-18 DIAGNOSIS — H353131 Nonexudative age-related macular degeneration, bilateral, early dry stage: Secondary | ICD-10-CM | POA: Diagnosis not present

## 2022-02-18 DIAGNOSIS — H25813 Combined forms of age-related cataract, bilateral: Secondary | ICD-10-CM | POA: Diagnosis not present

## 2022-04-01 DIAGNOSIS — Z23 Encounter for immunization: Secondary | ICD-10-CM | POA: Diagnosis not present

## 2022-06-23 DIAGNOSIS — E785 Hyperlipidemia, unspecified: Secondary | ICD-10-CM | POA: Diagnosis not present

## 2022-06-23 DIAGNOSIS — M2550 Pain in unspecified joint: Secondary | ICD-10-CM | POA: Diagnosis not present

## 2022-06-23 DIAGNOSIS — M8580 Other specified disorders of bone density and structure, unspecified site: Secondary | ICD-10-CM | POA: Diagnosis not present

## 2022-06-23 DIAGNOSIS — Z79899 Other long term (current) drug therapy: Secondary | ICD-10-CM | POA: Diagnosis not present

## 2022-06-23 DIAGNOSIS — M0579 Rheumatoid arthritis with rheumatoid factor of multiple sites without organ or systems involvement: Secondary | ICD-10-CM | POA: Diagnosis not present

## 2022-07-16 DIAGNOSIS — Z1231 Encounter for screening mammogram for malignant neoplasm of breast: Secondary | ICD-10-CM | POA: Diagnosis not present

## 2022-08-14 DIAGNOSIS — E1169 Type 2 diabetes mellitus with other specified complication: Secondary | ICD-10-CM | POA: Diagnosis not present

## 2022-09-23 DIAGNOSIS — Z79899 Other long term (current) drug therapy: Secondary | ICD-10-CM | POA: Diagnosis not present

## 2022-11-03 DIAGNOSIS — J069 Acute upper respiratory infection, unspecified: Secondary | ICD-10-CM | POA: Diagnosis not present

## 2022-12-26 DIAGNOSIS — M8580 Other specified disorders of bone density and structure, unspecified site: Secondary | ICD-10-CM | POA: Diagnosis not present

## 2022-12-26 DIAGNOSIS — M0579 Rheumatoid arthritis with rheumatoid factor of multiple sites without organ or systems involvement: Secondary | ICD-10-CM | POA: Diagnosis not present

## 2022-12-26 DIAGNOSIS — E785 Hyperlipidemia, unspecified: Secondary | ICD-10-CM | POA: Diagnosis not present

## 2022-12-26 DIAGNOSIS — Z79899 Other long term (current) drug therapy: Secondary | ICD-10-CM | POA: Diagnosis not present

## 2023-01-05 DIAGNOSIS — K080 Exfoliation of teeth due to systemic causes: Secondary | ICD-10-CM | POA: Diagnosis not present

## 2023-01-29 DIAGNOSIS — K080 Exfoliation of teeth due to systemic causes: Secondary | ICD-10-CM | POA: Diagnosis not present

## 2023-03-30 DIAGNOSIS — M0579 Rheumatoid arthritis with rheumatoid factor of multiple sites without organ or systems involvement: Secondary | ICD-10-CM | POA: Diagnosis not present

## 2023-04-15 DIAGNOSIS — Z79899 Other long term (current) drug therapy: Secondary | ICD-10-CM | POA: Diagnosis not present

## 2023-04-27 DIAGNOSIS — F331 Major depressive disorder, recurrent, moderate: Secondary | ICD-10-CM | POA: Diagnosis not present

## 2023-04-27 DIAGNOSIS — D84821 Immunodeficiency due to drugs: Secondary | ICD-10-CM | POA: Diagnosis not present

## 2023-04-27 DIAGNOSIS — Z1331 Encounter for screening for depression: Secondary | ICD-10-CM | POA: Diagnosis not present

## 2023-04-27 DIAGNOSIS — Z0000 Encounter for general adult medical examination without abnormal findings: Secondary | ICD-10-CM | POA: Diagnosis not present

## 2023-04-27 DIAGNOSIS — E7800 Pure hypercholesterolemia, unspecified: Secondary | ICD-10-CM | POA: Diagnosis not present

## 2023-04-27 DIAGNOSIS — M0579 Rheumatoid arthritis with rheumatoid factor of multiple sites without organ or systems involvement: Secondary | ICD-10-CM | POA: Diagnosis not present

## 2023-04-27 DIAGNOSIS — E559 Vitamin D deficiency, unspecified: Secondary | ICD-10-CM | POA: Diagnosis not present

## 2023-04-27 DIAGNOSIS — E119 Type 2 diabetes mellitus without complications: Secondary | ICD-10-CM | POA: Diagnosis not present

## 2023-06-21 DIAGNOSIS — E119 Type 2 diabetes mellitus without complications: Secondary | ICD-10-CM | POA: Diagnosis not present

## 2023-07-22 DIAGNOSIS — E119 Type 2 diabetes mellitus without complications: Secondary | ICD-10-CM | POA: Diagnosis not present

## 2023-07-23 DIAGNOSIS — Z1231 Encounter for screening mammogram for malignant neoplasm of breast: Secondary | ICD-10-CM | POA: Diagnosis not present

## 2023-08-22 DIAGNOSIS — E119 Type 2 diabetes mellitus without complications: Secondary | ICD-10-CM | POA: Diagnosis not present

## 2023-08-26 DIAGNOSIS — Z79899 Other long term (current) drug therapy: Secondary | ICD-10-CM | POA: Diagnosis not present

## 2023-08-26 DIAGNOSIS — K080 Exfoliation of teeth due to systemic causes: Secondary | ICD-10-CM | POA: Diagnosis not present

## 2023-09-17 DIAGNOSIS — K080 Exfoliation of teeth due to systemic causes: Secondary | ICD-10-CM | POA: Diagnosis not present

## 2023-09-19 DIAGNOSIS — E119 Type 2 diabetes mellitus without complications: Secondary | ICD-10-CM | POA: Diagnosis not present

## 2023-09-21 DIAGNOSIS — M79641 Pain in right hand: Secondary | ICD-10-CM | POA: Diagnosis not present

## 2023-09-21 DIAGNOSIS — Z79899 Other long term (current) drug therapy: Secondary | ICD-10-CM | POA: Diagnosis not present

## 2023-09-21 DIAGNOSIS — M8580 Other specified disorders of bone density and structure, unspecified site: Secondary | ICD-10-CM | POA: Diagnosis not present

## 2023-09-21 DIAGNOSIS — M810 Age-related osteoporosis without current pathological fracture: Secondary | ICD-10-CM | POA: Diagnosis not present

## 2023-09-21 DIAGNOSIS — M79642 Pain in left hand: Secondary | ICD-10-CM | POA: Diagnosis not present

## 2023-09-21 DIAGNOSIS — E785 Hyperlipidemia, unspecified: Secondary | ICD-10-CM | POA: Diagnosis not present

## 2023-09-21 DIAGNOSIS — M0579 Rheumatoid arthritis with rheumatoid factor of multiple sites without organ or systems involvement: Secondary | ICD-10-CM | POA: Diagnosis not present

## 2023-10-20 DIAGNOSIS — E119 Type 2 diabetes mellitus without complications: Secondary | ICD-10-CM | POA: Diagnosis not present

## 2023-11-16 DIAGNOSIS — E119 Type 2 diabetes mellitus without complications: Secondary | ICD-10-CM | POA: Diagnosis not present

## 2023-11-16 DIAGNOSIS — M0579 Rheumatoid arthritis with rheumatoid factor of multiple sites without organ or systems involvement: Secondary | ICD-10-CM | POA: Diagnosis not present

## 2023-11-16 DIAGNOSIS — D84821 Immunodeficiency due to drugs: Secondary | ICD-10-CM | POA: Diagnosis not present

## 2023-11-16 DIAGNOSIS — R413 Other amnesia: Secondary | ICD-10-CM | POA: Diagnosis not present

## 2023-11-16 DIAGNOSIS — E7800 Pure hypercholesterolemia, unspecified: Secondary | ICD-10-CM | POA: Diagnosis not present

## 2023-11-16 DIAGNOSIS — F331 Major depressive disorder, recurrent, moderate: Secondary | ICD-10-CM | POA: Diagnosis not present

## 2023-11-19 DIAGNOSIS — E119 Type 2 diabetes mellitus without complications: Secondary | ICD-10-CM | POA: Diagnosis not present

## 2023-12-10 DIAGNOSIS — R413 Other amnesia: Secondary | ICD-10-CM | POA: Diagnosis not present

## 2023-12-10 DIAGNOSIS — E559 Vitamin D deficiency, unspecified: Secondary | ICD-10-CM | POA: Diagnosis not present

## 2023-12-20 DIAGNOSIS — E119 Type 2 diabetes mellitus without complications: Secondary | ICD-10-CM | POA: Diagnosis not present

## 2024-01-19 DIAGNOSIS — E119 Type 2 diabetes mellitus without complications: Secondary | ICD-10-CM | POA: Diagnosis not present

## 2024-02-19 DIAGNOSIS — E119 Type 2 diabetes mellitus without complications: Secondary | ICD-10-CM | POA: Diagnosis not present

## 2024-03-21 DIAGNOSIS — E119 Type 2 diabetes mellitus without complications: Secondary | ICD-10-CM | POA: Diagnosis not present

## 2024-04-04 DIAGNOSIS — M0579 Rheumatoid arthritis with rheumatoid factor of multiple sites without organ or systems involvement: Secondary | ICD-10-CM | POA: Diagnosis not present

## 2024-04-04 DIAGNOSIS — Z79899 Other long term (current) drug therapy: Secondary | ICD-10-CM | POA: Diagnosis not present

## 2024-04-11 DIAGNOSIS — Z79899 Other long term (current) drug therapy: Secondary | ICD-10-CM | POA: Diagnosis not present

## 2024-04-11 DIAGNOSIS — M0579 Rheumatoid arthritis with rheumatoid factor of multiple sites without organ or systems involvement: Secondary | ICD-10-CM | POA: Diagnosis not present

## 2024-04-11 DIAGNOSIS — M2550 Pain in unspecified joint: Secondary | ICD-10-CM | POA: Diagnosis not present

## 2024-04-11 DIAGNOSIS — Z23 Encounter for immunization: Secondary | ICD-10-CM | POA: Diagnosis not present

## 2024-04-11 DIAGNOSIS — E785 Hyperlipidemia, unspecified: Secondary | ICD-10-CM | POA: Diagnosis not present

## 2024-04-19 DIAGNOSIS — E119 Type 2 diabetes mellitus without complications: Secondary | ICD-10-CM | POA: Diagnosis not present

## 2024-04-19 DIAGNOSIS — H40023 Open angle with borderline findings, high risk, bilateral: Secondary | ICD-10-CM | POA: Diagnosis not present

## 2024-04-19 DIAGNOSIS — H353132 Nonexudative age-related macular degeneration, bilateral, intermediate dry stage: Secondary | ICD-10-CM | POA: Diagnosis not present

## 2024-04-19 DIAGNOSIS — H524 Presbyopia: Secondary | ICD-10-CM | POA: Diagnosis not present

## 2024-04-19 DIAGNOSIS — H5212 Myopia, left eye: Secondary | ICD-10-CM | POA: Diagnosis not present

## 2024-04-19 DIAGNOSIS — H25813 Combined forms of age-related cataract, bilateral: Secondary | ICD-10-CM | POA: Diagnosis not present

## 2024-04-20 DIAGNOSIS — E119 Type 2 diabetes mellitus without complications: Secondary | ICD-10-CM | POA: Diagnosis not present

## 2024-05-21 DIAGNOSIS — E119 Type 2 diabetes mellitus without complications: Secondary | ICD-10-CM | POA: Diagnosis not present

## 2024-06-09 DIAGNOSIS — Z0000 Encounter for general adult medical examination without abnormal findings: Secondary | ICD-10-CM | POA: Diagnosis not present

## 2024-06-09 DIAGNOSIS — M0579 Rheumatoid arthritis with rheumatoid factor of multiple sites without organ or systems involvement: Secondary | ICD-10-CM | POA: Diagnosis not present

## 2024-06-09 DIAGNOSIS — F909 Attention-deficit hyperactivity disorder, unspecified type: Secondary | ICD-10-CM | POA: Diagnosis not present

## 2024-06-09 DIAGNOSIS — D84821 Immunodeficiency due to drugs: Secondary | ICD-10-CM | POA: Diagnosis not present

## 2024-06-09 DIAGNOSIS — E119 Type 2 diabetes mellitus without complications: Secondary | ICD-10-CM | POA: Diagnosis not present

## 2024-06-13 DIAGNOSIS — E7800 Pure hypercholesterolemia, unspecified: Secondary | ICD-10-CM | POA: Diagnosis not present

## 2024-06-13 DIAGNOSIS — E119 Type 2 diabetes mellitus without complications: Secondary | ICD-10-CM | POA: Diagnosis not present

## 2024-06-13 DIAGNOSIS — E559 Vitamin D deficiency, unspecified: Secondary | ICD-10-CM | POA: Diagnosis not present

## 2024-08-26 NOTE — Progress Notes (Signed)
 Michele Adkins                                          MRN: [id number]   08/26/2024   The [facility] Quality Team Specialist reviewed this patient medical record for the purposes of chart review for care gap closure. The following were reviewed: abstraction for care gap closure-kidney health evaluation for diabetes:eGFR .    [facility] Quality Team
